# Patient Record
Sex: Female | Born: 1975
Health system: Southern US, Community
[De-identification: ages and names within clinical notes are randomized; demographics above are authoritative.]

## PROBLEM LIST (undated history)

## (undated) DIAGNOSIS — F419 Anxiety disorder, unspecified: Secondary | ICD-10-CM

## (undated) DIAGNOSIS — I10 Essential (primary) hypertension: Secondary | ICD-10-CM

## (undated) DIAGNOSIS — K219 Gastro-esophageal reflux disease without esophagitis: Secondary | ICD-10-CM

## (undated) DIAGNOSIS — D649 Anemia, unspecified: Secondary | ICD-10-CM

## (undated) DIAGNOSIS — E079 Disorder of thyroid, unspecified: Secondary | ICD-10-CM

## (undated) HISTORY — DX: Essential (primary) hypertension: I10

## (undated) HISTORY — DX: Disorder of thyroid, unspecified: E07.9

## (undated) HISTORY — PX: OTHER SURGICAL HISTORY: SHX169

## (undated) HISTORY — DX: Anemia, unspecified: D64.9

## (undated) HISTORY — PX: FINGER FRACTURE SURGERY: SHX638

## (undated) HISTORY — PX: TUBAL LIGATION: SHX77

## (undated) HISTORY — DX: Gastro-esophageal reflux disease without esophagitis: K21.9

---

## 2001-10-01 ENCOUNTER — Encounter: Admission: RE | Admit: 2001-10-01 | Discharge: 2001-10-01 | Payer: Self-pay | Admitting: Family Medicine

## 2001-10-29 ENCOUNTER — Encounter: Admission: RE | Admit: 2001-10-29 | Discharge: 2001-10-29 | Payer: Self-pay | Admitting: Family Medicine

## 2001-12-05 ENCOUNTER — Encounter: Admission: RE | Admit: 2001-12-05 | Discharge: 2001-12-05 | Payer: Self-pay | Admitting: Family Medicine

## 2001-12-15 ENCOUNTER — Encounter: Admission: RE | Admit: 2001-12-15 | Discharge: 2001-12-15 | Payer: Self-pay | Admitting: Family Medicine

## 2001-12-17 ENCOUNTER — Ambulatory Visit (HOSPITAL_COMMUNITY): Admission: RE | Admit: 2001-12-17 | Discharge: 2001-12-17 | Payer: Self-pay | Admitting: *Deleted

## 2002-01-14 ENCOUNTER — Encounter: Admission: RE | Admit: 2002-01-14 | Discharge: 2002-01-14 | Payer: Self-pay | Admitting: Family Medicine

## 2002-02-18 ENCOUNTER — Encounter: Admission: RE | Admit: 2002-02-18 | Discharge: 2002-02-18 | Payer: Self-pay | Admitting: Family Medicine

## 2002-02-20 ENCOUNTER — Encounter: Admission: RE | Admit: 2002-02-20 | Discharge: 2002-02-20 | Payer: Self-pay | Admitting: Family Medicine

## 2002-03-03 ENCOUNTER — Encounter: Admission: RE | Admit: 2002-03-03 | Discharge: 2002-03-03 | Payer: Self-pay | Admitting: *Deleted

## 2002-03-04 ENCOUNTER — Encounter: Admission: RE | Admit: 2002-03-04 | Discharge: 2002-03-04 | Payer: Self-pay | Admitting: Family Medicine

## 2002-03-18 ENCOUNTER — Encounter: Admission: RE | Admit: 2002-03-18 | Discharge: 2002-03-18 | Payer: Self-pay | Admitting: Family Medicine

## 2002-03-30 ENCOUNTER — Encounter: Admission: RE | Admit: 2002-03-30 | Discharge: 2002-03-30 | Payer: Self-pay | Admitting: Family Medicine

## 2002-04-17 ENCOUNTER — Encounter: Admission: RE | Admit: 2002-04-17 | Discharge: 2002-04-17 | Payer: Self-pay | Admitting: Family Medicine

## 2002-04-30 ENCOUNTER — Inpatient Hospital Stay (HOSPITAL_COMMUNITY): Admission: AD | Admit: 2002-04-30 | Discharge: 2002-04-30 | Payer: Self-pay | Admitting: *Deleted

## 2002-05-01 ENCOUNTER — Encounter: Admission: RE | Admit: 2002-05-01 | Discharge: 2002-05-01 | Payer: Self-pay | Admitting: Family Medicine

## 2002-05-07 ENCOUNTER — Encounter: Admission: RE | Admit: 2002-05-07 | Discharge: 2002-05-07 | Payer: Self-pay | Admitting: Family Medicine

## 2002-05-14 ENCOUNTER — Encounter: Admission: RE | Admit: 2002-05-14 | Discharge: 2002-05-14 | Payer: Self-pay | Admitting: Sports Medicine

## 2002-05-18 ENCOUNTER — Encounter: Admission: RE | Admit: 2002-05-18 | Discharge: 2002-05-18 | Payer: Self-pay | Admitting: Family Medicine

## 2002-05-20 ENCOUNTER — Inpatient Hospital Stay (HOSPITAL_COMMUNITY): Admission: AD | Admit: 2002-05-20 | Discharge: 2002-05-22 | Payer: Self-pay | Admitting: *Deleted

## 2002-07-02 ENCOUNTER — Encounter (INDEPENDENT_AMBULATORY_CARE_PROVIDER_SITE_OTHER): Payer: Self-pay | Admitting: *Deleted

## 2002-07-02 ENCOUNTER — Encounter: Admission: RE | Admit: 2002-07-02 | Discharge: 2002-07-02 | Payer: Self-pay | Admitting: Family Medicine

## 2003-09-22 ENCOUNTER — Encounter (INDEPENDENT_AMBULATORY_CARE_PROVIDER_SITE_OTHER): Payer: Self-pay | Admitting: Specialist

## 2003-09-22 ENCOUNTER — Encounter: Admission: RE | Admit: 2003-09-22 | Discharge: 2003-09-22 | Payer: Self-pay | Admitting: Family Medicine

## 2004-02-05 ENCOUNTER — Inpatient Hospital Stay (HOSPITAL_COMMUNITY): Admission: AD | Admit: 2004-02-05 | Discharge: 2004-02-05 | Payer: Self-pay | Admitting: Obstetrics and Gynecology

## 2004-09-19 ENCOUNTER — Encounter (INDEPENDENT_AMBULATORY_CARE_PROVIDER_SITE_OTHER): Payer: Self-pay | Admitting: *Deleted

## 2004-09-19 LAB — CONVERTED CEMR LAB

## 2004-09-21 ENCOUNTER — Ambulatory Visit: Payer: Self-pay | Admitting: Family Medicine

## 2004-09-28 ENCOUNTER — Ambulatory Visit: Payer: Self-pay | Admitting: Family Medicine

## 2004-10-02 ENCOUNTER — Ambulatory Visit (HOSPITAL_COMMUNITY): Admission: RE | Admit: 2004-10-02 | Discharge: 2004-10-02 | Payer: Self-pay | Admitting: Family Medicine

## 2004-11-21 ENCOUNTER — Ambulatory Visit: Payer: Self-pay | Admitting: Family Medicine

## 2004-11-22 ENCOUNTER — Ambulatory Visit: Payer: Self-pay | Admitting: Family Medicine

## 2004-12-05 ENCOUNTER — Ambulatory Visit: Payer: Self-pay | Admitting: Family Medicine

## 2004-12-19 ENCOUNTER — Ambulatory Visit: Payer: Self-pay | Admitting: Family Medicine

## 2005-01-04 ENCOUNTER — Ambulatory Visit: Payer: Self-pay | Admitting: Family Medicine

## 2005-01-11 ENCOUNTER — Ambulatory Visit: Payer: Self-pay | Admitting: Family Medicine

## 2005-01-11 ENCOUNTER — Inpatient Hospital Stay (HOSPITAL_COMMUNITY): Admission: AD | Admit: 2005-01-11 | Discharge: 2005-01-14 | Payer: Self-pay | Admitting: *Deleted

## 2005-01-11 ENCOUNTER — Ambulatory Visit: Payer: Self-pay | Admitting: Sports Medicine

## 2005-01-12 ENCOUNTER — Encounter (INDEPENDENT_AMBULATORY_CARE_PROVIDER_SITE_OTHER): Payer: Self-pay | Admitting: Specialist

## 2005-01-15 ENCOUNTER — Encounter: Admission: RE | Admit: 2005-01-15 | Discharge: 2005-02-14 | Payer: Self-pay | Admitting: Family Medicine

## 2005-03-15 ENCOUNTER — Encounter: Admission: RE | Admit: 2005-03-15 | Discharge: 2005-04-14 | Payer: Self-pay | Admitting: Family Medicine

## 2005-05-15 ENCOUNTER — Encounter: Admission: RE | Admit: 2005-05-15 | Discharge: 2005-06-14 | Payer: Self-pay | Admitting: Family Medicine

## 2005-06-05 ENCOUNTER — Emergency Department (HOSPITAL_COMMUNITY): Admission: EM | Admit: 2005-06-05 | Discharge: 2005-06-05 | Payer: Self-pay | Admitting: Emergency Medicine

## 2005-06-12 ENCOUNTER — Ambulatory Visit: Payer: Self-pay | Admitting: Sports Medicine

## 2005-06-22 ENCOUNTER — Encounter (HOSPITAL_COMMUNITY): Admission: RE | Admit: 2005-06-22 | Discharge: 2005-08-20 | Payer: Self-pay | Admitting: Vascular Surgery

## 2005-06-29 ENCOUNTER — Ambulatory Visit: Payer: Self-pay | Admitting: Family Medicine

## 2005-07-15 ENCOUNTER — Encounter: Admission: RE | Admit: 2005-07-15 | Discharge: 2005-08-14 | Payer: Self-pay | Admitting: Family Medicine

## 2005-08-15 ENCOUNTER — Encounter: Admission: RE | Admit: 2005-08-15 | Discharge: 2005-09-13 | Payer: Self-pay | Admitting: Family Medicine

## 2005-09-14 ENCOUNTER — Encounter: Admission: RE | Admit: 2005-09-14 | Discharge: 2005-10-14 | Payer: Self-pay | Admitting: Family Medicine

## 2005-10-15 ENCOUNTER — Encounter: Admission: RE | Admit: 2005-10-15 | Discharge: 2005-11-13 | Payer: Self-pay | Admitting: Family Medicine

## 2005-11-14 ENCOUNTER — Encounter: Admission: RE | Admit: 2005-11-14 | Discharge: 2005-12-14 | Payer: Self-pay | Admitting: Family Medicine

## 2005-11-16 ENCOUNTER — Emergency Department (HOSPITAL_COMMUNITY): Admission: EM | Admit: 2005-11-16 | Discharge: 2005-11-16 | Payer: Self-pay | Admitting: Family Medicine

## 2005-12-04 ENCOUNTER — Emergency Department (HOSPITAL_COMMUNITY): Admission: EM | Admit: 2005-12-04 | Discharge: 2005-12-04 | Payer: Self-pay | Admitting: Emergency Medicine

## 2005-12-07 ENCOUNTER — Ambulatory Visit: Payer: Self-pay | Admitting: Family Medicine

## 2005-12-15 ENCOUNTER — Encounter: Admission: RE | Admit: 2005-12-15 | Discharge: 2006-01-14 | Payer: Self-pay | Admitting: Family Medicine

## 2006-01-15 ENCOUNTER — Ambulatory Visit: Payer: Self-pay | Admitting: Family Medicine

## 2006-01-15 ENCOUNTER — Encounter: Admission: RE | Admit: 2006-01-15 | Discharge: 2006-02-11 | Payer: Self-pay | Admitting: Family Medicine

## 2006-02-11 ENCOUNTER — Emergency Department (HOSPITAL_COMMUNITY): Admission: EM | Admit: 2006-02-11 | Discharge: 2006-02-11 | Payer: Self-pay | Admitting: Emergency Medicine

## 2006-02-12 ENCOUNTER — Encounter: Admission: RE | Admit: 2006-02-12 | Discharge: 2006-03-14 | Payer: Self-pay | Admitting: Family Medicine

## 2006-02-27 ENCOUNTER — Emergency Department (HOSPITAL_COMMUNITY): Admission: EM | Admit: 2006-02-27 | Discharge: 2006-02-27 | Payer: Self-pay | Admitting: Emergency Medicine

## 2006-03-19 ENCOUNTER — Ambulatory Visit: Payer: Self-pay | Admitting: Family Medicine

## 2006-05-01 ENCOUNTER — Ambulatory Visit: Payer: Self-pay | Admitting: Family Medicine

## 2006-10-05 IMAGING — CT CT HEAD W/O CM
1 of 2 series · 13 of 30 positions shown, 17 images · IV contrast (agent unspecified)
Comparison: None.

CLINICAL DATA: Blurry vision with flashes of light.  Headache.  Dizziness.  Generalized fatigue.
 HEAD CT WITHOUT CONTRAST:
TECHNIQUE: Contiguous axial images were obtained from the base of the skull through the vertex according to standard protocol without contrast.

[Series 2: brain · axial · 0.49mm/px · z∈[+138,+269]mm · 13 of 32 slices shown, 17 images]
[im 3/32  brain]
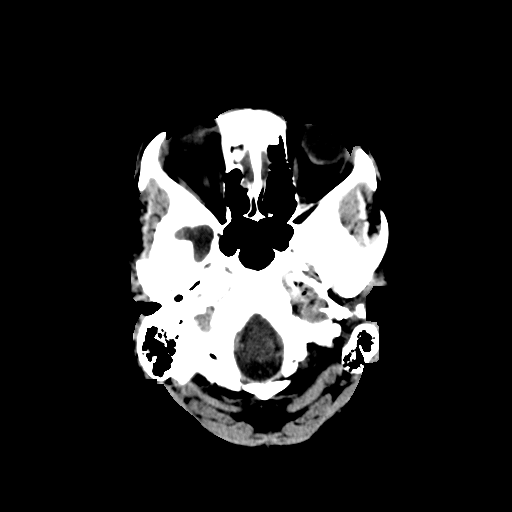
[im 3/32  bone]
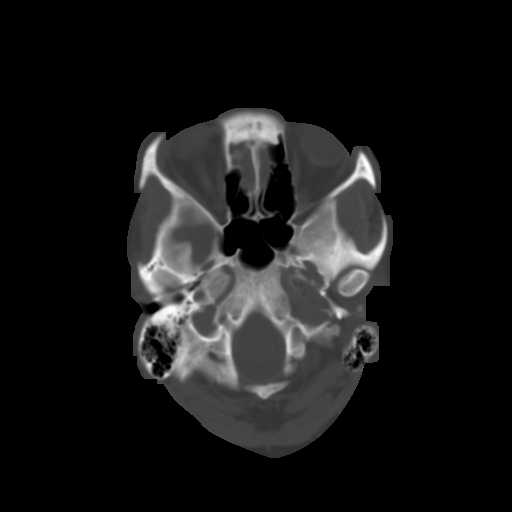
[im 5/32  brain]
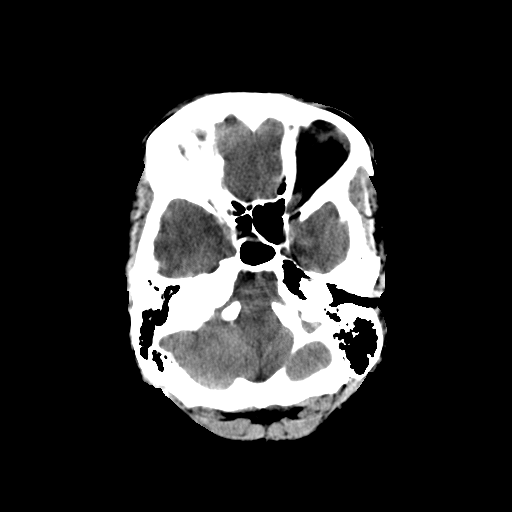
[im 7/32  brain]
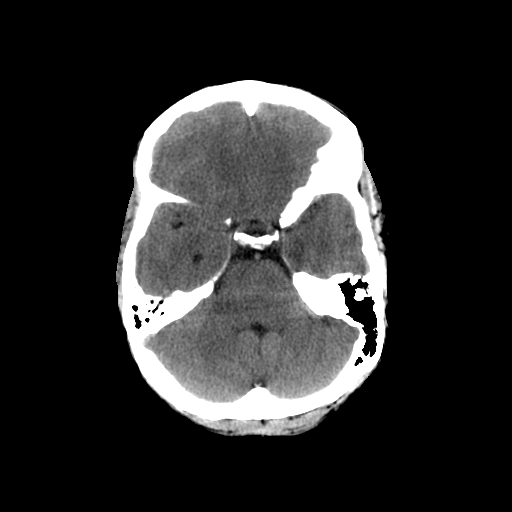
[im 9/32  brain]
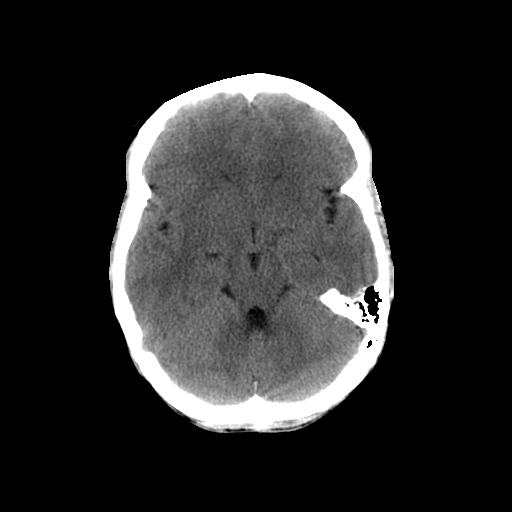
[im 12/32  brain]
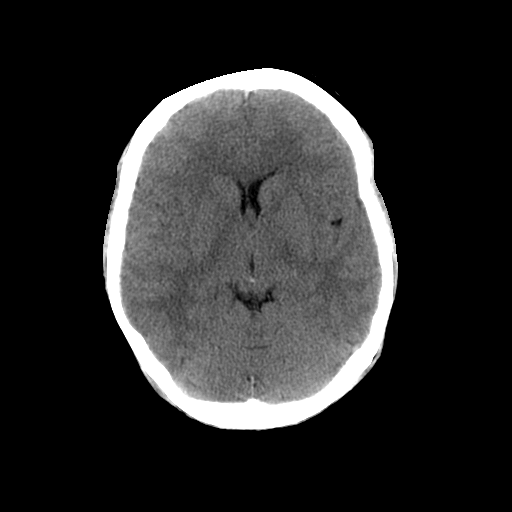
[im 12/32  bone]
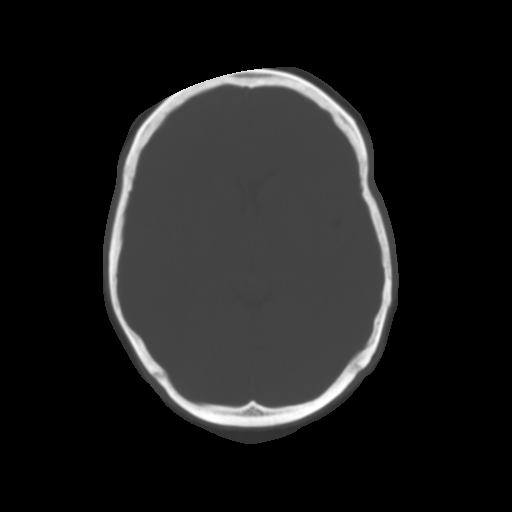
[im 14/32  brain]
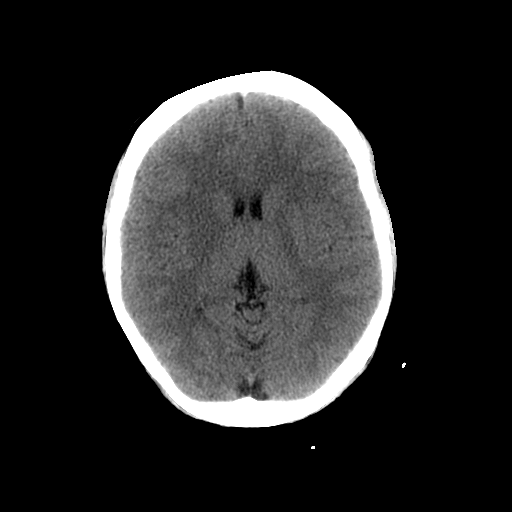
[im 16/32  brain]
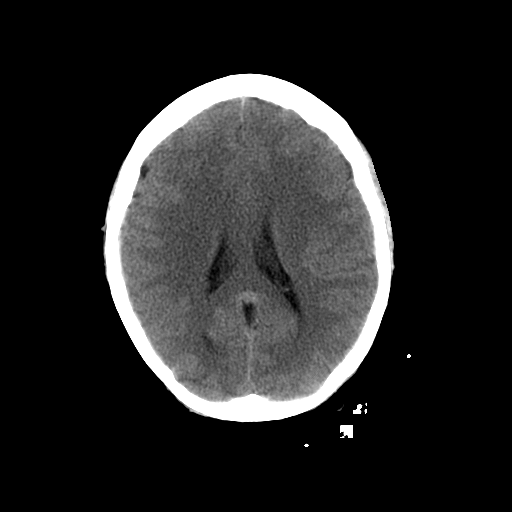
[im 18/32  brain]
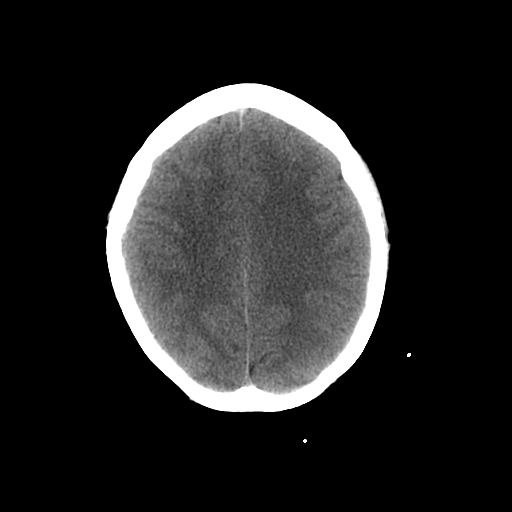
[im 20/32  brain]
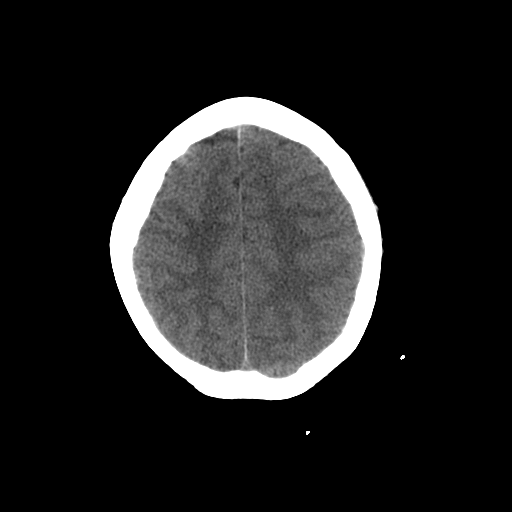
[im 20/32  bone]
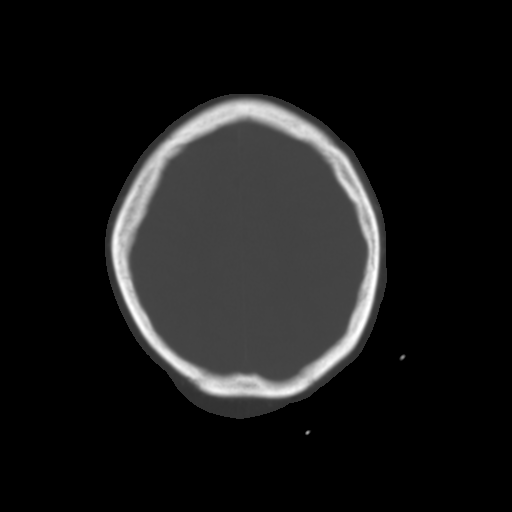
[im 23/32  brain]
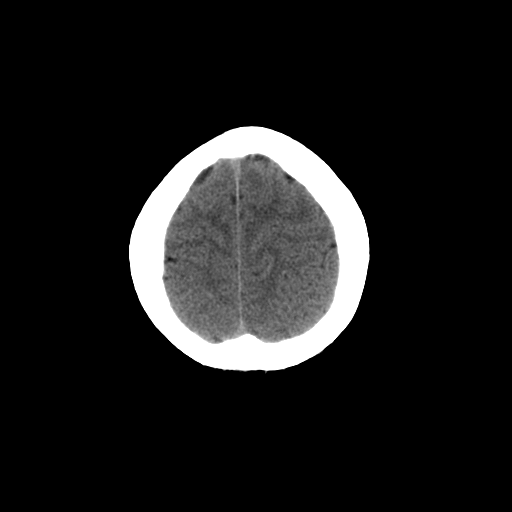
[im 25/32  brain]
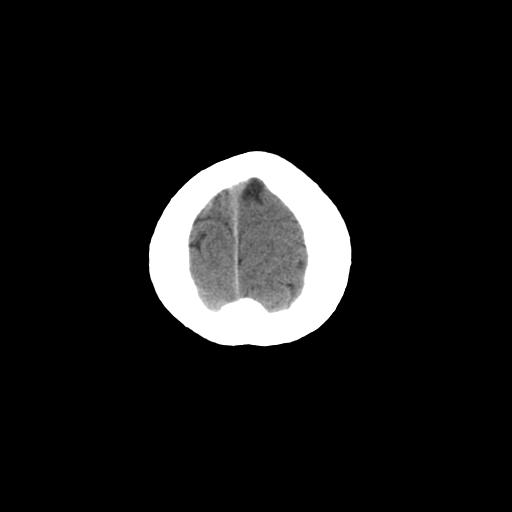
[im 27/32  brain]
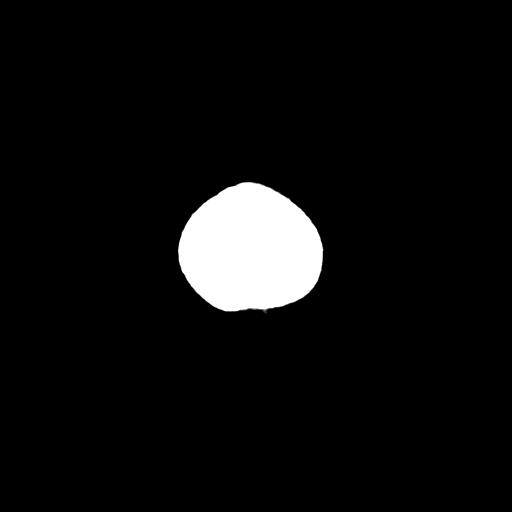
[im 29/32  brain]
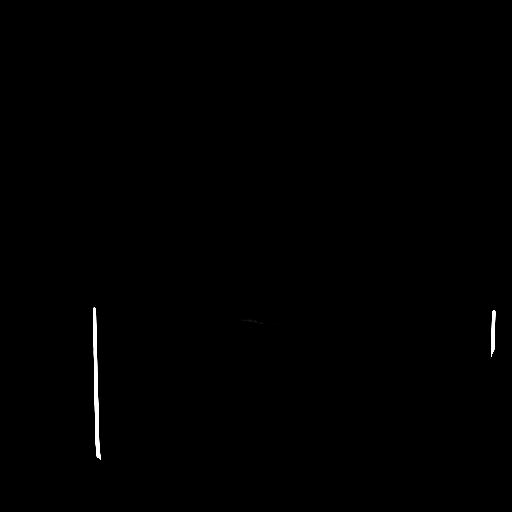
[im 29/32  bone]
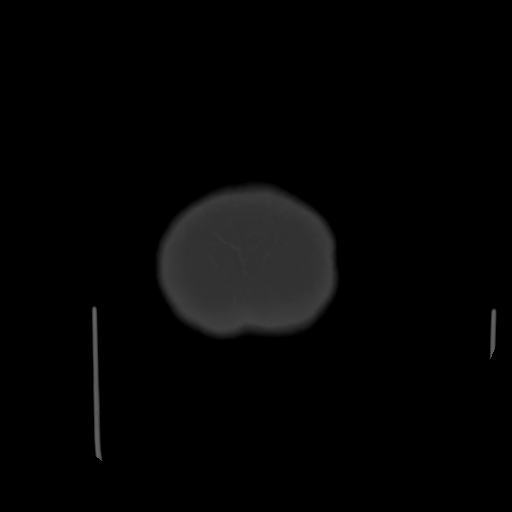

[13 of 30 positions shown; findings below may reference images not displayed]

FINDINGS: There is no evidence for acute hemorrhage, hydrocephalus, mass effect, or abnormal extra-axial fluid collection.  No definite CT evidence for acute ischemia.  
 Frontal sinuses are hypoplastic.  Aerated paranasal sinuses are without evidence for mucosal thickening or air fluid levels.
IMPRESSION: No acute intracranial abnormality.

## 2007-01-17 ENCOUNTER — Encounter (INDEPENDENT_AMBULATORY_CARE_PROVIDER_SITE_OTHER): Payer: Self-pay | Admitting: *Deleted

## 2007-02-12 ENCOUNTER — Ambulatory Visit: Payer: Self-pay | Admitting: Sports Medicine

## 2007-02-12 ENCOUNTER — Encounter: Payer: Self-pay | Admitting: Family Medicine

## 2007-02-12 DIAGNOSIS — Z8639 Personal history of other endocrine, nutritional and metabolic disease: Secondary | ICD-10-CM | POA: Insufficient documentation

## 2007-02-12 LAB — CONVERTED CEMR LAB
GC Probe Amp, Genital: NEGATIVE
TSH: 0.673 microintl units/mL (ref 0.350–5.50)

## 2009-03-16 ENCOUNTER — Ambulatory Visit: Payer: Self-pay | Admitting: Family Medicine

## 2009-03-16 ENCOUNTER — Encounter: Payer: Self-pay | Admitting: Family Medicine

## 2009-03-16 LAB — CONVERTED CEMR LAB: Whiff Test: POSITIVE

## 2009-03-18 LAB — CONVERTED CEMR LAB
Chlamydia, DNA Probe: NEGATIVE
Cholesterol: 223 mg/dL — ABNORMAL HIGH (ref 0–200)
GC Probe Amp, Genital: NEGATIVE
HDL: 73 mg/dL (ref >39)
TSH: 1.403 microintl units/mL (ref 0.350–4.500)
Triglycerides: 87 mg/dL (ref <150)
VLDL: 17 mg/dL (ref 0–40)

## 2010-12-09 ENCOUNTER — Encounter: Payer: Self-pay | Admitting: Family Medicine

## 2010-12-10 ENCOUNTER — Encounter: Payer: Self-pay | Admitting: Vascular Surgery

## 2011-02-16 ENCOUNTER — Encounter: Payer: Self-pay | Admitting: Family Medicine

## 2011-03-12 ENCOUNTER — Other Ambulatory Visit (HOSPITAL_COMMUNITY)
Admission: RE | Admit: 2011-03-12 | Discharge: 2011-03-12 | Disposition: A | Discharge status: Home / Self Care | Payer: 59 | Source: Ambulatory Visit | Attending: Family Medicine | Admitting: Family Medicine

## 2011-03-12 ENCOUNTER — Encounter: Payer: Self-pay | Admitting: Family Medicine

## 2011-03-12 ENCOUNTER — Ambulatory Visit (INDEPENDENT_AMBULATORY_CARE_PROVIDER_SITE_OTHER): Payer: 59 | Admitting: Family Medicine

## 2011-03-12 DIAGNOSIS — I10 Essential (primary) hypertension: Secondary | ICD-10-CM

## 2011-03-12 DIAGNOSIS — Z124 Encounter for screening for malignant neoplasm of cervix: Secondary | ICD-10-CM

## 2011-03-12 DIAGNOSIS — Z01419 Encounter for gynecological examination (general) (routine) without abnormal findings: Secondary | ICD-10-CM | POA: Insufficient documentation

## 2011-03-12 DIAGNOSIS — Z862 Personal history of diseases of the blood and blood-forming organs and certain disorders involving the immune mechanism: Secondary | ICD-10-CM

## 2011-03-12 DIAGNOSIS — E669 Obesity, unspecified: Secondary | ICD-10-CM

## 2011-03-12 DIAGNOSIS — Z8639 Personal history of other endocrine, nutritional and metabolic disease: Secondary | ICD-10-CM

## 2011-03-12 MED ORDER — METOPROLOL SUCCINATE ER 25 MG PO TB24
25.0000 mg | ORAL_TABLET | Freq: Every day | ORAL | Status: DC
Start: 2011-03-12 — End: 2011-03-12

## 2011-03-12 MED ORDER — METHIMAZOLE 10 MG PO TABS: 10.0000 mg | ORAL_TABLET | Freq: Three times a day (TID) | ORAL | Status: DC

## 2011-03-12 MED ORDER — METOPROLOL SUCCINATE ER 25 MG PO TB24: 25.0000 mg | ORAL_TABLET | Freq: Every day | ORAL | Status: DC

## 2011-03-12 NOTE — Patient Instructions (Signed)
It was nice to meet you.  I want you to start taking your medications again.  For now, I will leave them at the dose you were taking a year ago.  I want you to make a lab appointment for some time, and we will check your thyroid, your cholesterol, your blood sugar, and your kidney function.  I will contact you with the lab results.  Please make an appointment to follow up on your thyroid in 3 months.

## 2011-03-13 NOTE — Progress Notes (Signed)
  Subjective:    Patient ID: Alexandra Benjamin, female    DOB: 09/24/1976, 35 y.o.   MRN: [id number]  HPI Patient presents for well woman exam.  She reports that she has not been to the doctor in a long time, and she has stopped taking all her medications, even though she knows she needs them.  She stopped taking them about one year ago.   She says she can tell she needs her thyroid checked and needs to take her thyroid medication because she has been feeling her heart race from time to time, and feels warm all the time compared to everyone else.    She knows that her blood pressure reading today is too high, but says she does not check her blood pressure at home or in a store, and would not know it was high by how she feels.   Obesity- she says she has lost some weight but knows that can happen when her thyroid is misbehaving.  She has not made significant diet or exercise changes.     Review of Systems  Constitutional: Negative for fever.  HENT: Negative for rhinorrhea.   Eyes: Negative for visual disturbance.  Respiratory: Negative for shortness of breath.   Cardiovascular: Positive for palpitations. Negative for chest pain.  Gastrointestinal: Positive for diarrhea.  Genitourinary: Negative for dysuria.  Musculoskeletal: Negative for myalgias and arthralgias.  Skin: Negative for rash.  Neurological: Positive for tremors. Negative for dizziness.  Psychiatric/Behavioral: Negative for dysphoric mood.       Objective:   Physical Exam BP 141/84  Pulse 78  Temp(Src) 98.9 F (37.2 C) (Oral)  Ht 5\' 5"  (1.651 m)  Wt 184 lb (83.462 kg)  BMI 30.62 kg/m2  LMP 02/12/2011 General appearance: alert, cooperative and no distress Eyes: conjunctivae/corneas clear. PERRL, EOM's intact. Fundi benign. Throat: lips, mucosa, and tongue normal; teeth and gums normal Neck: no adenopathy, no JVD, supple, symmetrical, trachea midline and thyroid not enlarged, symmetric, no tenderness/mass/nodules Lungs:  clear to auscultation bilaterally Breasts: normal appearance, no masses or tenderness, No nipple retraction or dimpling Heart: regular rate and rhythm, S1, S2 normal, no murmur, click, rub or gallop Abdomen: soft, non-tender; bowel sounds normal; no masses,  no organomegaly Pelvic: cervix normal in appearance, external genitalia normal, no adnexal masses or tenderness, no cervical motion tenderness, rectovaginal septum normal, uterus normal size, shape, and consistency and vagina normal without discharge Pulses: 2+ and symmetric       Assessment & Plan:

## 2011-03-13 NOTE — Assessment & Plan Note (Signed)
Restart metoprolol XL at previous dose.  Have asked patient to return in 3 months to ensure it is under better control.

## 2011-03-13 NOTE — Assessment & Plan Note (Addendum)
Will check cholesterol and fasting blood sugar as she is at high-risk for HLD and DM.  Patient has lost weight but is likely hyperthyroid.  Encouraged lifestyle changes.

## 2011-03-13 NOTE — Assessment & Plan Note (Addendum)
Will check TSH.  For now, restart methimazole at previous dose, and will adjust as appropriate when lab results back. Have asked patient to return in 3 months to be sure she is on the correct dose.

## 2011-03-15 ENCOUNTER — Other Ambulatory Visit: Payer: 59

## 2011-03-15 DIAGNOSIS — E669 Obesity, unspecified: Secondary | ICD-10-CM

## 2011-03-15 DIAGNOSIS — I10 Essential (primary) hypertension: Secondary | ICD-10-CM

## 2011-03-15 DIAGNOSIS — Z8639 Personal history of other endocrine, nutritional and metabolic disease: Secondary | ICD-10-CM

## 2011-03-15 LAB — LIPID PANEL
HDL: 71 mg/dL (ref >39)
LDL Cholesterol: 139 mg/dL — ABNORMAL HIGH (ref 0–99)
VLDL: 12 mg/dL (ref 0–40)

## 2011-03-15 LAB — BASIC METABOLIC PANEL
CO2: 26 mEq/L (ref 19–32)
Chloride: 105 mEq/L (ref 96–112)
Potassium: 4.2 mEq/L (ref 3.5–5.3)
Sodium: 140 mEq/L (ref 135–145)

## 2011-03-15 LAB — TSH: TSH: 2.599 u[IU]/mL (ref 0.350–4.500)

## 2011-03-15 NOTE — Progress Notes (Signed)
Cmp,tsh and flp done today [facility] Virella

## 2011-03-19 ENCOUNTER — Encounter: Payer: Self-pay | Admitting: Family Medicine

## 2011-03-19 MED ORDER — METHIMAZOLE 5 MG PO TABS: 5.0000 mg | ORAL_TABLET | Freq: Three times a day (TID) | ORAL | Status: DC

## 2011-04-06 NOTE — Op Note (Signed)
NAMEJONIAH, BEDNARSKI              ACCOUNT NO.:  [id number]   MEDICAL RECORD NO.:  [id number]          PATIENT TYPE:  INP   LOCATION:  9165                          FACILITY:  [facility]   PHYSICIAN:  Conni Elliot, M.D.DATE OF BIRTH:  Nov 19, 1976   DATE OF PROCEDURE:  01/12/2005  DATE OF DISCHARGE:                                 OPERATIVE REPORT   PREOPERATIVE DIAGNOSIS:  Desires surgical sterilization.   POSTOPERATIVE DIAGNOSIS:  Desires surgical sterilization.   PROCEDURE:  Modified bilateral Pomeroy tubal ligation.   SURGEON:  Conni Elliot, M.D.   ANESTHESIA:  Continuous lumbar epidural.   DESCRIPTION OF PROCEDURE:  After placing the patient under continuous lumbar  epidural anesthetic with the patient supine, the abdomen was prepped and  draped in the usual sterile fashion.  A periumbilical incision was made and  incision made through the skin, subcutaneous tissue, and fascia.  The  peritoneal cavity was entered.  The right fallopian tube was identified and  grasped with a Babcock clamp, followed to its fimbriated end.  The tube was  brought into the operative field and doubly suture ligated.  1.5 to 2 cm  segment was excised.  Hemostasis was adequate.  A similar procedure was done  on the opposite side.  Hemostasis was again adequate.  Anterior peritoneum,  fascia, subcutaneous tissue, and skin were closed in routine fashion.  Estimated blood loss less than 10 mL.  Needle, sponge, and instrument counts  correct.      ASG/MEDQ  D:  01/12/2005  T:  01/12/2005  Job:  [id number]

## 2012-03-31 ENCOUNTER — Encounter: Payer: 59 | Admitting: Family Medicine

## 2012-04-11 ENCOUNTER — Encounter: Payer: 59 | Admitting: Family Medicine

## 2012-04-24 ENCOUNTER — Encounter: Payer: 59 | Admitting: Family Medicine

## 2012-05-01 ENCOUNTER — Other Ambulatory Visit: Payer: Self-pay | Admitting: Family Medicine

## 2012-06-09 ENCOUNTER — Encounter: Payer: 59 | Admitting: Family Medicine

## 2013-08-07 ENCOUNTER — Emergency Department (HOSPITAL_COMMUNITY)
Admission: EM | Admit: 2013-08-07 | Discharge: 2013-08-07 | Disposition: A | Discharge status: Home / Self Care | Payer: 59 | Source: Home / Self Care

## 2013-08-07 ENCOUNTER — Encounter (HOSPITAL_COMMUNITY): Payer: Self-pay | Admitting: Emergency Medicine

## 2013-08-07 DIAGNOSIS — H0011 Chalazion right upper eyelid: Secondary | ICD-10-CM

## 2013-08-07 NOTE — ED Provider Notes (Signed)
CSN: [id number]     Arrival date & time 08/07/13  1140 History   None    Chief Complaint  Patient presents with  . Stye    right x 1 month.    (Consider location/radiation/quality/duration/timing/severity/associated sxs/prior Treatment) HPI Comments: Mrs. Cockerham presents with a right upper eyelid white area that has been non-responding to compresses. Onset x 1 month. No drainage. No pain. No loss in vision. No eye redness.   The history is provided by the patient.    Past Medical History  Diagnosis Date  . Hypertension   . Thyroid disease    Past Surgical History  Procedure Laterality Date  . Tubal ligation    . Finger fracture surgery     Family History  Problem Relation Age of Onset  . Heart disease Father   . Diabetes Maternal Grandmother   . Diabetes Paternal Grandmother    History  Substance Use Topics  . Smoking status: Former Games developer  . Smokeless tobacco: Not on file  . Alcohol Use: 0.0 oz/week    1-2 Glasses of wine per week   OB History   Grav Para Term Preterm Abortions TAB SAB Ect Mult Living                 Review of Systems  All other systems reviewed and are negative.    Allergies  Review of patient's allergies indicates no known allergies.  Home Medications   Current Outpatient Rx  Name  Route  Sig  Dispense  Refill  . metoprolol succinate (TOPROL-XL) 25 MG 24 hr tablet      TAKE 1 TABLET BY MOUTH DAILY.   30 tablet   11   . EXPIRED: methimazole (TAPAZOLE) 5 MG tablet   Oral   Take 1 tablet (5 mg total) by mouth 3 (three) times daily.   90 tablet   1    BP 129/95  Pulse 64  Temp(Src) 98.4 F (36.9 C) (Oral)  Resp 14  SpO2 100%  LMP 07/10/2013 Physical Exam  Nursing note and vitals reviewed. Constitutional: She is oriented to person, place, and time. She appears well-developed and well-nourished.  HENT:  Head: Normocephalic and atraumatic.  Eyes: Pupils are equal, round, and reactive to light.  Right upper lid with white,  glandular localized inflammation, no pain to palpation. No signs of infection. Smaller than a eraser head  Neurological: She is alert and oriented to person, place, and time.  Skin: Skin is warm and dry.  Psychiatric: Her behavior is normal.    ED Course  Procedures (including critical care time) Labs Review Labs Reviewed - No data to display Imaging Review No results found.  MDM   1. Chalazion of right upper eyelid    Call for an appt with on call Opth for removal.      Azucena Fallen, PA-C 08/07/13 1256

## 2013-08-07 NOTE — ED Notes (Signed)
C/o stye on right eye for over a month. Pt states that she uses warm compresses and it goes down some but returns. Also using saline drops with no relief.  Denies pain or vision problems.

## 2013-08-12 NOTE — ED Provider Notes (Signed)
Medical screening examination/treatment/procedure(s) were performed by resident physician or non-physician practitioner and as supervising physician I was immediately available for consultation/collaboration.   Barkley Bruns MD.   Linna Hoff, MD 08/12/13 [phone]

## 2014-04-25 ENCOUNTER — Emergency Department (HOSPITAL_COMMUNITY)
Admission: EM | Admit: 2014-04-25 | Discharge: 2014-04-25 | Disposition: A | Discharge status: Home / Self Care | Payer: 59 | Source: Home / Self Care | Attending: Family Medicine | Admitting: Family Medicine

## 2014-04-25 ENCOUNTER — Encounter (HOSPITAL_COMMUNITY): Payer: Self-pay | Admitting: Emergency Medicine

## 2014-04-25 DIAGNOSIS — K529 Noninfective gastroenteritis and colitis, unspecified: Secondary | ICD-10-CM

## 2014-04-25 DIAGNOSIS — K5289 Other specified noninfective gastroenteritis and colitis: Secondary | ICD-10-CM

## 2014-04-25 LAB — POCT URINALYSIS DIP (DEVICE)
BILIRUBIN URINE: NEGATIVE
GLUCOSE, UA: NEGATIVE mg/dL
Hgb urine dipstick: NEGATIVE
Ketones, ur: 15 mg/dL — AB
Leukocytes, UA: NEGATIVE
Nitrite: NEGATIVE
Protein, ur: NEGATIVE mg/dL
SPECIFIC GRAVITY, URINE: 1.015 (ref 1.005–1.030)
Urobilinogen, UA: 0.2 mg/dL (ref 0.0–1.0)
pH: 7 (ref 5.0–8.0)

## 2014-04-25 LAB — COMPREHENSIVE METABOLIC PANEL
ALT: 11 U/L (ref 0–35)
AST: 16 U/L (ref 0–37)
Albumin: 4 g/dL (ref 3.5–5.2)
Alkaline Phosphatase: 49 U/L (ref 39–117)
BUN: 12 mg/dL (ref 6–23)
CALCIUM: 10.3 mg/dL (ref 8.4–10.5)
CO2: 25 meq/L (ref 19–32)
Chloride: 100 mEq/L (ref 96–112)
Creatinine, Ser: 0.69 mg/dL (ref 0.50–1.10)
GFR calc Af Amer: >90 mL/min (ref >90)
GLUCOSE: 88 mg/dL (ref 70–99)
Potassium: 3.8 mEq/L (ref 3.7–5.3)
SODIUM: 139 meq/L (ref 137–147)
Total Bilirubin: 0.4 mg/dL (ref 0.3–1.2)
Total Protein: 8.2 g/dL (ref 6.0–8.3)

## 2014-04-25 LAB — POCT PREGNANCY, URINE: PREG TEST UR: NEGATIVE

## 2014-04-25 LAB — CBC
HEMATOCRIT: 37.2 % (ref 36.0–46.0)
Hemoglobin: 12.4 g/dL (ref 12.0–15.0)
MCH: 26.6 pg (ref 26.0–34.0)
MCHC: 33.3 g/dL (ref 30.0–36.0)
MCV: 79.8 fL (ref 78.0–100.0)
Platelets: 308 10*3/uL (ref 150–400)
RBC: 4.66 MIL/uL (ref 3.87–5.11)
RDW: 13.9 % (ref 11.5–15.5)
WBC: 9 10*3/uL (ref 4.0–10.5)

## 2014-04-25 LAB — LIPASE, BLOOD: Lipase: 22 U/L (ref 11–59)

## 2014-04-25 MED ORDER — OMEPRAZOLE 40 MG PO CPDR: 40.0000 mg | DELAYED_RELEASE_CAPSULE | Freq: Every day | ORAL | Status: DC

## 2014-04-25 MED ORDER — ONDANSETRON HCL 4 MG/2ML IJ SOLN
INTRAMUSCULAR | Status: AC
  Filled 2014-04-25: qty 2

## 2014-04-25 MED ORDER — SODIUM CHLORIDE 0.9 % IV BOLUS (SEPSIS)
1000.0000 mL | Freq: Once | INTRAVENOUS | Status: AC
  Administered 2014-04-25: 1000 mL via INTRAVENOUS

## 2014-04-25 MED ORDER — ONDANSETRON HCL 8 MG PO TABS: 8.0000 mg | ORAL_TABLET | Freq: Three times a day (TID) | ORAL | Status: DC | PRN

## 2014-04-25 MED ORDER — ONDANSETRON HCL 4 MG/2ML IJ SOLN
4.0000 mg | Freq: Once | INTRAMUSCULAR | Status: AC
  Administered 2014-04-25: 4 mg via INTRAVENOUS

## 2014-04-25 NOTE — ED Notes (Signed)
Pt unable to give urine sample at this time.  She will try after IV is finished

## 2014-04-25 NOTE — ED Provider Notes (Signed)
Alexandra Benjamin is a 38 y.o. female who presents to Urgent Care today for vomiting and diarrhea starting today. This is associated with abdominal cramping. She denies any fevers or chills trouble breathing chest pain or palpitations. She has tried some Pepto-Bismol which has not helped. She denies any blood in the vomit or diarrhea. She feels well otherwise.   Past Medical History  Diagnosis Date  . Hypertension   . Thyroid disease    History  Substance Use Topics  . Smoking status: Former Games developermoker  . Smokeless tobacco: Not on file  . Alcohol Use: 0.0 oz/week    1-2 Glasses of wine per week   ROS as above Medications: No current facility-administered medications for this encounter.   Current Outpatient Prescriptions  Medication Sig Dispense Refill  . methimazole (TAPAZOLE) 5 MG tablet Take 1 tablet (5 mg total) by mouth 3 (three) times daily.  90 tablet  1  . metoprolol succinate (TOPROL-XL) 25 MG 24 hr tablet TAKE 1 TABLET BY MOUTH DAILY.  30 tablet  11  . omeprazole (PRILOSEC) 40 MG capsule Take 1 capsule (40 mg total) by mouth daily.  30 capsule  1  . ondansetron (ZOFRAN) 8 MG tablet Take 1 tablet (8 mg total) by mouth every 8 (eight) hours as needed for nausea or vomiting.  20 tablet  0    Exam:  BP 146/104  Pulse 112  Temp(Src) 98.8 F (37.1 C) (Oral)  Resp 16  SpO2 100%  LMP 04/02/2014 Following 1 L of normal saline patient's heart rate decreased to 72 beats per minute  Gen: Well NAD HEENT: EOMI,  MMM Lungs: Normal work of breathing. CTABL Heart: Tachycardia but regular no MRG Abd: NABS, Soft. Nondistended. Mildly tender palpation upper left quadrant without rebound or guarding. Exts: Brisk capillary refill, warm and well perfused.   She was given 1 L normal saline and 4 mg of IV Zofran. She felt much better. Her heart rate normalized.  Results for orders placed during the hospital encounter of 04/25/14 (from the past 24 hour(s))  CBC     Status: None   Collection  Time    04/25/14  6:00 PM      Result Value Ref Range   WBC 9.0  4.0 - 10.5 K/uL   RBC 4.66  3.87 - 5.11 MIL/uL   Hemoglobin 12.4  12.0 - 15.0 g/dL   HCT 16.137.2  09.636.0 - 04.546.0 %   MCV 79.8  78.0 - 100.0 fL   MCH 26.6  26.0 - 34.0 pg   MCHC 33.3  30.0 - 36.0 g/dL   RDW 40.913.9  81.111.5 - 91.415.5 %   Platelets 308  150 - 400 K/uL  COMPREHENSIVE METABOLIC PANEL     Status: None   Collection Time    04/25/14  6:00 PM      Result Value Ref Range   Sodium 139  137 - 147 mEq/L   Potassium 3.8  3.7 - 5.3 mEq/L   Chloride 100  96 - 112 mEq/L   CO2 25  19 - 32 mEq/L   Glucose, Bld 88  70 - 99 mg/dL   BUN 12  6 - 23 mg/dL   Creatinine, Ser 7.820.69  0.50 - 1.10 mg/dL   Calcium 95.610.3  8.4 - 21.310.5 mg/dL   Total Protein 8.2  6.0 - 8.3 g/dL   Albumin 4.0  3.5 - 5.2 g/dL   AST 16  0 - 37 U/L   ALT 11  0 -  35 U/L   Alkaline Phosphatase 49  39 - 117 U/L   Total Bilirubin 0.4  0.3 - 1.2 mg/dL   GFR calc non Af Amer >90  >90 mL/min   GFR calc Af Amer >90  >90 mL/min  LIPASE, BLOOD     Status: None   Collection Time    04/25/14  6:00 PM      Result Value Ref Range   Lipase 22  11 - 59 U/L  POCT URINALYSIS DIP (DEVICE)     Status: Abnormal   Collection Time    04/25/14  6:58 PM      Result Value Ref Range   Glucose, UA NEGATIVE  NEGATIVE mg/dL   Bilirubin Urine NEGATIVE  NEGATIVE   Ketones, ur 15 (*) NEGATIVE mg/dL   Specific Gravity, Urine 1.015  1.005 - 1.030   Hgb urine dipstick NEGATIVE  NEGATIVE   pH 7.0  5.0 - 8.0   Protein, ur NEGATIVE  NEGATIVE mg/dL   Urobilinogen, UA 0.2  0.0 - 1.0 mg/dL   Nitrite NEGATIVE  NEGATIVE   Leukocytes, UA NEGATIVE  NEGATIVE  POCT PREGNANCY, URINE     Status: None   Collection Time    04/25/14  7:02 PM      Result Value Ref Range   Preg Test, Ur NEGATIVE  NEGATIVE   No results found.  Assessment and Plan: 38 y.o. female with gastroenteritis. Plan to treat with Zofran. Additionally use omeprazole for possible mild gastritis. Watchful waiting. Followup with  primary care provider.  Discussed warning signs or symptoms. Please see discharge instructions. Patient expresses understanding.    Rodolph Bong, MD 04/25/14 [phone]

## 2014-04-25 NOTE — Discharge Instructions (Signed)
Thank you for coming in today. If your belly pain worsens, or you have high fever, bad vomiting, blood in your stool or black tarry stool go to the Emergency Room.   Viral Gastroenteritis Viral gastroenteritis is also known as stomach flu. This condition affects the stomach and intestinal tract. It can cause sudden diarrhea and vomiting. The illness typically lasts 3 to 8 days. Most people develop an immune response that eventually gets rid of the virus. While this natural response develops, the virus can make you quite ill. CAUSES  Many different viruses can cause gastroenteritis, such as rotavirus or noroviruses. You can catch one of these viruses by consuming contaminated food or water. You may also catch a virus by sharing utensils or other personal items with an infected person or by touching a contaminated surface. SYMPTOMS  The most common symptoms are diarrhea and vomiting. These problems can cause a severe loss of body fluids (dehydration) and a body salt (electrolyte) imbalance. Other symptoms may include:  Fever.  Headache.  Fatigue.  Abdominal pain. DIAGNOSIS  Your caregiver can usually diagnose viral gastroenteritis based on your symptoms and a physical exam. A stool sample may also be taken to test for the presence of viruses or other infections. TREATMENT  This illness typically goes away on its own. Treatments are aimed at rehydration. The most serious cases of viral gastroenteritis involve vomiting so severely that you are not able to keep fluids down. In these cases, fluids must be given through an intravenous line (IV). HOME CARE INSTRUCTIONS   Drink enough fluids to keep your urine clear or pale yellow. Drink small amounts of fluids frequently and increase the amounts as tolerated.  Ask your caregiver for specific rehydration instructions.  Avoid:  Foods high in sugar.  Alcohol.  Carbonated drinks.  Tobacco.  Juice.  Caffeine drinks.  Extremely hot or cold  fluids.  Fatty, greasy foods.  Too much intake of anything at one time.  Dairy products until 24 to 48 hours after diarrhea stops.  You may consume probiotics. Probiotics are active cultures of beneficial bacteria. They may lessen the amount and number of diarrheal stools in adults. Probiotics can be found in yogurt with active cultures and in supplements.  Wash your hands well to avoid spreading the virus.  Only take over-the-counter or prescription medicines for pain, discomfort, or fever as directed by your caregiver. Do not give aspirin to children. Antidiarrheal medicines are not recommended.  Ask your caregiver if you should continue to take your regular prescribed and over-the-counter medicines.  Keep all follow-up appointments as directed by your caregiver. SEEK IMMEDIATE MEDICAL CARE IF:   You are unable to keep fluids down.  You do not urinate at least once every 6 to 8 hours.  You develop shortness of breath.  You notice blood in your stool or vomit. This may look like coffee grounds.  You have abdominal pain that increases or is concentrated in one small area (localized).  You have persistent vomiting or diarrhea.  You have a fever.  The patient is a child younger than 3 months, and he or she has a fever.  The patient is a child older than 3 months, and he or she has a fever and persistent symptoms.  The patient is a child older than 3 months, and he or she has a fever and symptoms suddenly get worse.  The patient is a baby, and he or she has no tears when crying. MAKE SURE YOU:  Understand these instructions.  Will watch your condition.  Will get help right away if you are not doing well or get worse. Document Released: 11/05/2005 Document Revised: 01/28/2012 Document Reviewed: 08/22/2011 [facility] Patient Information 2014 [place], [place].  Abdominal Pain, Women Abdominal (stomach, pelvic, or belly) pain can be caused by many things. It is important to  tell your doctor:  The location of the pain.  Does it come and go or is it present all the time?  Are there things that start the pain (eating certain foods, exercise)?  Are there other symptoms associated with the pain (fever, nausea, vomiting, diarrhea)? All of this is helpful to know when trying to find the cause of the pain. CAUSES   Stomach: virus or bacteria infection, or ulcer.  Intestine: appendicitis (inflamed appendix), regional ileitis (Crohn's disease), ulcerative colitis (inflamed colon), irritable bowel syndrome, diverticulitis (inflamed diverticulum of the colon), or cancer of the stomach or intestine.  Gallbladder disease or stones in the gallbladder.  Kidney disease, kidney stones, or infection.  Pancreas infection or cancer.  Fibromyalgia (pain disorder).  Diseases of the female organs:  Uterus: fibroid (non-cancerous) tumors or infection.  Fallopian tubes: infection or tubal pregnancy.  Ovary: cysts or tumors.  Pelvic adhesions (scar tissue).  Endometriosis (uterus lining tissue growing in the pelvis and on the pelvic organs).  Pelvic congestion syndrome (female organs filling up with blood just before the menstrual period).  Pain with the menstrual period.  Pain with ovulation (producing an egg).  Pain with an IUD (intrauterine device, birth control) in the uterus.  Cancer of the female organs.  Functional pain (pain not caused by a disease, may improve without treatment).  Psychological pain.  Depression. DIAGNOSIS  Your doctor will decide the seriousness of your pain by doing an examination.  Blood tests.  X-rays.  Ultrasound.  CT scan (computed tomography, special type of X-ray).  MRI (magnetic resonance imaging).  Cultures, for infection.  Barium enema (dye inserted in the large intestine, to better view it with X-rays).  Colonoscopy (looking in intestine with a lighted tube).  Laparoscopy (minor surgery, looking in abdomen  with a lighted tube).  Major abdominal exploratory surgery (looking in abdomen with a large incision). TREATMENT  The treatment will depend on the cause of the pain.   Many cases can be observed and treated at home.  Over-the-counter medicines recommended by your caregiver.  Prescription medicine.  Antibiotics, for infection.  Birth control pills, for painful periods or for ovulation pain.  Hormone treatment, for endometriosis.  Nerve blocking injections.  Physical therapy.  Antidepressants.  Counseling with a psychologist or psychiatrist.  Minor or major surgery. HOME CARE INSTRUCTIONS   Do not take laxatives, unless directed by your caregiver.  Take over-the-counter pain medicine only if ordered by your caregiver. Do not take aspirin because it can cause an upset stomach or bleeding.  Try a clear liquid diet (broth or water) as ordered by your caregiver. Slowly move to a bland diet, as tolerated, if the pain is related to the stomach or intestine.  Have a thermometer and take your temperature several times a day, and record it.  Bed rest and sleep, if it helps the pain.  Avoid sexual intercourse, if it causes pain.  Avoid stressful situations.  Keep your follow-up appointments and tests, as your caregiver orders.  If the pain does not go away with medicine or surgery, you may try:  Acupuncture.  Relaxation exercises (yoga, meditation).  Group therapy.  Counseling.  SEEK MEDICAL CARE IF:   You notice certain foods cause stomach pain.  Your home care treatment is not helping your pain.  You need stronger pain medicine.  You want your IUD removed.  You feel faint or lightheaded.  You develop nausea and vomiting.  You develop a rash.  You are having side effects or an allergy to your medicine. SEEK IMMEDIATE MEDICAL CARE IF:   Your pain does not go away or gets worse.  You have a fever.  Your pain is felt only in portions of the abdomen. The  right side could possibly be appendicitis. The left lower portion of the abdomen could be colitis or diverticulitis.  You are passing blood in your stools (bright red or black tarry stools, with or without vomiting).  You have blood in your urine.  You develop chills, with or without a fever.  You pass out. MAKE SURE YOU:   Understand these instructions.  Will watch your condition.  Will get help right away if you are not doing well or get worse. Document Released: 09/02/2007 Document Revised: 01/28/2012 Document Reviewed: 09/22/2009 [facility] Patient Information 2014 [place], [place].

## 2014-04-25 NOTE — ED Notes (Signed)
On set of vomiting and diarrhea today.  States having abdominal cramping.

## 2016-02-09 ENCOUNTER — Encounter: Payer: 59 | Admitting: Family Medicine

## 2016-02-16 ENCOUNTER — Encounter: Payer: 59 | Admitting: Family Medicine

## 2016-08-24 ENCOUNTER — Ambulatory Visit (HOSPITAL_COMMUNITY)
Admission: EM | Admit: 2016-08-24 | Discharge: 2016-08-24 | Disposition: A | Discharge status: Home / Self Care | Payer: 59 | Attending: Family Medicine | Admitting: Family Medicine

## 2016-08-24 ENCOUNTER — Encounter (HOSPITAL_COMMUNITY): Payer: Self-pay | Admitting: Emergency Medicine

## 2016-08-24 ENCOUNTER — Ambulatory Visit (INDEPENDENT_AMBULATORY_CARE_PROVIDER_SITE_OTHER): Payer: 59

## 2016-08-24 DIAGNOSIS — F430 Acute stress reaction: Secondary | ICD-10-CM

## 2016-08-24 DIAGNOSIS — F411 Generalized anxiety disorder: Secondary | ICD-10-CM

## 2016-08-24 DIAGNOSIS — R0602 Shortness of breath: Secondary | ICD-10-CM | POA: Diagnosis not present

## 2016-08-24 MED ORDER — CLONAZEPAM 0.5 MG PO TABS: 0.5000 mg | ORAL_TABLET | Freq: Three times a day (TID) | ORAL | 0 refills | Status: DC | PRN

## 2016-08-24 MED FILL — clonazePAM 0.5 MG TABS: 0.5 | 7 days supply | Qty: 20 | Fill #0

## 2016-08-24 NOTE — ED Provider Notes (Signed)
[facility]    CSN: [id number] Arrival date & time: 08/24/16  1138     History   Chief Complaint Chief Complaint  Patient presents with  . Shortness of Breath  . Dizziness    HPI Logann L Georgeanne Nimompey is a 40 y.o. female.   The history is provided by the patient and the spouse.  Shortness of Breath  Severity:  Moderate Onset quality:  Sudden Duration:  2 days Progression:  Unchanged Chronicity:  New Context: emotional upset   Context comment:  Under a lot of stress with work and family etc Relieved by:  None tried Worsened by:  Nothing Ineffective treatments:  None tried Associated symptoms: no abdominal pain, no chest pain, no diaphoresis, no fever and no wheezing   Dizziness  Associated symptoms: shortness of breath   Associated symptoms: no chest pain and no palpitations     Past Medical History:  Diagnosis Date  . Hypertension   . Thyroid disease     Patient Active Problem List   Diagnosis Date Noted  . HTN (hypertension), benign 03/12/2011  . Obesity 03/12/2011  . GRAVE'S DISEASE, HX OF 02/12/2007    Past Surgical History:  Procedure Laterality Date  . FINGER FRACTURE SURGERY    . TUBAL LIGATION      OB History    No data available       Home Medications    Prior to Admission medications   Medication Sig Start Date End Date Taking? Authorizing Provider  methimazole (TAPAZOLE) 5 MG tablet Take 1 tablet (5 mg total) by mouth 3 (three) times daily. 03/19/11 08/24/16 Yes Ardyth Galachel Chamberlain, MD  metoprolol succinate (TOPROL-XL) 25 MG 24 hr tablet TAKE 1 TABLET BY MOUTH DAILY. 05/01/12  Yes Ardyth Galachel Chamberlain, MD  omeprazole (PRILOSEC) 40 MG capsule Take 1 capsule (40 mg total) by mouth daily. 04/25/14   Rodolph BongEvan S Corey, MD  ondansetron (ZOFRAN) 8 MG tablet Take 1 tablet (8 mg total) by mouth every 8 (eight) hours as needed for nausea or vomiting. 04/25/14   Rodolph BongEvan S Corey, MD    Family History Family History  Problem Relation Age of Onset  . Heart  disease Father   . Diabetes Maternal Grandmother   . Diabetes Paternal Grandmother     Social History Social History  Substance Use Topics  . Smoking status: Former Games developermoker  . Smokeless tobacco: Never Used  . Alcohol use 0.0 oz/week    1 - 2 Glasses of wine per week     Allergies   Review of patient's allergies indicates no known allergies.   Review of Systems Review of Systems  Constitutional: Negative for diaphoresis and fever.  Respiratory: Positive for shortness of breath. Negative for wheezing.   Cardiovascular: Negative for chest pain, palpitations and leg swelling.  Gastrointestinal: Negative for abdominal pain.  Neurological: Positive for dizziness.  All other systems reviewed and are negative.    Physical Exam Triage Vital Signs ED Triage Vitals  Enc Vitals Group     BP 08/24/16 1217 142/93     Pulse Rate 08/24/16 1217 83     Resp 08/24/16 1217 18     Temp 08/24/16 1217 98.6 F (37 C)     Temp Source 08/24/16 1217 Oral     SpO2 08/24/16 1217 100 %     Weight --      Height --      Head Circumference --      Peak Flow --  Pain Score 08/24/16 1219 5     Pain Loc --      Pain Edu? --      Excl. in GC? --    No data found.   Updated Vital Signs BP 142/93 (BP Location: Left Arm)   Pulse 83   Temp 98.6 F (37 C) (Oral)   Resp 18   SpO2 100%   Visual Acuity Right Eye Distance:   Left Eye Distance:   Bilateral Distance:    Right Eye Near:   Left Eye Near:    Bilateral Near:     Physical Exam  Constitutional: She is oriented to person, place, and time. She appears well-developed and well-nourished.  HENT:  Head: Normocephalic.  Mouth/Throat: Oropharynx is clear and moist.  Eyes: Conjunctivae and EOM are normal. Pupils are equal, round, and reactive to light.  Neck: Normal range of motion. Neck supple.  Cardiovascular: Normal rate, regular rhythm, normal heart sounds and intact distal pulses.   Pulmonary/Chest: Effort normal and breath  sounds normal.  Lymphadenopathy:    She has no cervical adenopathy.  Neurological: She is alert and oriented to person, place, and time.  Skin: Skin is warm and dry.  Nursing note and vitals reviewed.    UC Treatments / Results  Labs (all labs ordered are listed, but only abnormal results are displayed) Labs Reviewed - No data to display  EKG  EKG Interpretation None       Radiology No results found. X-rays reviewed and report per radiologist.  Procedures Procedures (including critical care time)  Medications Ordered in UC Medications - No data to display   Initial Impression / Assessment and Plan / UC Course  I have reviewed the triage vital signs and the nursing notes.  Pertinent labs & imaging results that were available during my care of the patient were reviewed by me and considered in my medical decision making (see chart for details).  Clinical Course  Value Comment By Time  DG Chest 2 View (Reviewed) Linna Hoff, MD 10/06 1345    Pt and husband agree with dx and rx, much relieved at d/c.  Final Clinical Impressions(s) / UC Diagnoses   Final diagnoses:  None    New Prescriptions New Prescriptions   No medications on file     Linna Hoff, MD 08/24/16 1357

## 2016-08-24 NOTE — ED Triage Notes (Signed)
Pt here for intermittent SOB/dizziness onset 2 days  Reports sx increases w/movement and w/stress  Reports school and job stress  A&O X4... NAD

## 2016-09-05 ENCOUNTER — Ambulatory Visit: Payer: 59 | Admitting: Internal Medicine

## 2016-09-10 ENCOUNTER — Ambulatory Visit: Payer: 59 | Admitting: Internal Medicine

## 2016-09-12 ENCOUNTER — Other Ambulatory Visit (HOSPITAL_COMMUNITY)
Admission: RE | Admit: 2016-09-12 | Discharge: 2016-09-12 | Disposition: A | Discharge status: Home / Self Care | Payer: 59 | Source: Ambulatory Visit | Attending: Family Medicine | Admitting: Family Medicine

## 2016-09-12 ENCOUNTER — Ambulatory Visit (INDEPENDENT_AMBULATORY_CARE_PROVIDER_SITE_OTHER): Payer: 59 | Admitting: Internal Medicine

## 2016-09-12 ENCOUNTER — Encounter: Payer: Self-pay | Admitting: Internal Medicine

## 2016-09-12 VITALS — BP 141/96 | HR 91 | Temp 98.3°F | Wt 224.0 lb

## 2016-09-12 DIAGNOSIS — Z1151 Encounter for screening for human papillomavirus (HPV): Secondary | ICD-10-CM | POA: Diagnosis not present

## 2016-09-12 DIAGNOSIS — Z01411 Encounter for gynecological examination (general) (routine) with abnormal findings: Secondary | ICD-10-CM | POA: Insufficient documentation

## 2016-09-12 DIAGNOSIS — R002 Palpitations: Secondary | ICD-10-CM

## 2016-09-12 DIAGNOSIS — Z01419 Encounter for gynecological examination (general) (routine) without abnormal findings: Secondary | ICD-10-CM | POA: Diagnosis not present

## 2016-09-12 DIAGNOSIS — Z113 Encounter for screening for infections with a predominantly sexual mode of transmission: Secondary | ICD-10-CM | POA: Insufficient documentation

## 2016-09-12 DIAGNOSIS — Z124 Encounter for screening for malignant neoplasm of cervix: Secondary | ICD-10-CM | POA: Diagnosis not present

## 2016-09-12 DIAGNOSIS — F411 Generalized anxiety disorder: Secondary | ICD-10-CM

## 2016-09-12 DIAGNOSIS — Z0000 Encounter for general adult medical examination without abnormal findings: Secondary | ICD-10-CM | POA: Insufficient documentation

## 2016-09-12 DIAGNOSIS — Z114 Encounter for screening for human immunodeficiency virus [HIV]: Secondary | ICD-10-CM

## 2016-09-12 LAB — T3, FREE: T3, Free: 2.5 pg/mL (ref 2.3–4.2)

## 2016-09-12 LAB — T4, FREE: FREE T4: 1.1 ng/dL (ref 0.8–1.8)

## 2016-09-12 LAB — TSH: TSH: 1.71 mIU/L

## 2016-09-12 NOTE — Progress Notes (Signed)
   [facility] Phone: [phone]  Subjective:  Edgar FriskCrista is a 40 year old female presenting to clinic for urgent care follow-up for anxiety. Patient states that she started having anxiety the beginning of October. She had episodes during which she would have chest pain, rapid heartbeat, shortness of breath, sweatiness, and feeling "panicky". She was seen at an urgent care on 10/6 and was diagnosed with anxiety. She is prescribed Klonopin tid prn. She states she has been having a lot of stress at work. 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 She has also had multiple family members pass away in the last year and a half and she states that this has taken a toll on her family. She has never had anxiety or panic attacks before. She has only taken for Klonopin pills because they "didn't make her feel high". She has a history of Graves' disease and has previously taken methimazole. She has not had her thyroid checked since 2012.  Health Care Maintenance: Pt overdue for pap.  ROS: See HPI for pertinent positives and negatives  Past Medical History- Graves' disease, hypertension, obesity  Family history reviewed for today's visit. No changes.  Social history- patient is a former smoker.  Objective: BP (!) 141/96   Pulse 91   Temp 98.3 F (36.8 C) (Oral)   Wt 224 lb (101.6 kg)   LMP 09/08/2016   SpO2 100%   BMI 37.28 kg/m  Gen: NAD, alert, cooperative with exam, pleasant  HEENT: NCAT, EOMI, MMM, no exophthalmos  Neck: FROM, supple, thyroid normal without any nodules CV: RRR, no murmur Resp: CTABL, no wheezes, normal work of breathing GU: External genitalia appears normal. Vaginal walls appear normal. Small amount of white discharge present. Cervix appears normal. No cervical motion tenderness. Bimanual exam normal without masses or tenderness. Psych: Appropriate behavior, normal rate of speech, no  SI/HI  Assessment/Plan: Anxiety: Patient presents for follow-up of anxiety after being seen at urgent care. She was prescribed Klonopin. She endorses episodes of chest pain, rapid heartbeat, shortness breath, sweatiness, and feeling panicky. She states this has never happened to her before. GAD-7 score is a 12. PHQ-9 score is a 3. She has a history of Graves' disease and was previously on methimazole. Her TSH has not been checked since 2012. She may be having generalized anxiety disorder vs panic attacks vs hyperthyroidism. She doesn't seem like an anxious person and has no history of anxiety, so I think her symptoms are more likely 2/2 to hyperthyroidism. - Will check a TSH, T4, and free T3 to rule out hyperthyroidism as a cause of patient's symptoms - If thyroid levels are normal, will have patient return to discuss treatment options for anxiety - Patient can continue taking Klonopin, which was prescribed by the urgent care physician. Will likely transition her to an SSRI if thyroid labs are normal. - Will call Pt to discuss lab results.  Health Care Maintenance: - Pap performed today. Will call patient to discuss results - HIV screening performed today, since we are already getting blood for thyroid check.   Willadean CarolKaty Mayo, MD PGY-2

## 2016-09-12 NOTE — Assessment & Plan Note (Signed)
-   Pap performed today. Will call patient to discuss results - HIV screening performed today, since we are already getting blood for thyroid check.

## 2016-09-12 NOTE — Assessment & Plan Note (Signed)
Patient presents for follow-up of anxiety after being seen at urgent care. She was prescribed Klonopin. She endorses episodes of chest pain, rapid heartbeat, shortness breath, sweatiness, and feeling panicky. She states this has never happened to her before. GAD-7 score is a 12. PHQ-9 score is a 3. She has a history of Graves' disease and was previously on methimazole. Her TSH has not been checked since 2012. She may be having generalized anxiety disorder vs panic attacks vs hyperthyroidism. She doesn't seem like an anxious person and has no history of anxiety, so I think her symptoms are more likely 2/2 to hyperthyroidism. - Will check a TSH, T4, and free T3 to rule out hyperthyroidism as a cause of patient's symptoms - If thyroid levels are normal, will have patient return to discuss treatment options for anxiety - Patient can continue taking Klonopin, which was prescribed by the urgent care physician. Will likely transition her to an SSRI if thyroid labs are normal. - Will call Pt to discuss lab results.

## 2016-09-12 NOTE — Patient Instructions (Signed)
It was so nice to see you!  We will check your thyroid levels today. I will call you with the results.  -Dr. Nancy MarusMayo

## 2016-09-13 LAB — HIV ANTIBODY (ROUTINE TESTING W REFLEX): HIV 1&2 Ab, 4th Generation: NONREACTIVE

## 2016-09-14 ENCOUNTER — Telehealth: Payer: Self-pay | Admitting: Internal Medicine

## 2016-09-14 ENCOUNTER — Other Ambulatory Visit: Payer: Self-pay | Admitting: Internal Medicine

## 2016-09-14 DIAGNOSIS — F411 Generalized anxiety disorder: Secondary | ICD-10-CM

## 2016-09-14 NOTE — Telephone Encounter (Signed)
Attempted to call patient to discuss her lab results. The call went to her voicemail, which had not been set up yet, so I was unable to leave a message. I will try calling again later today to discuss her results.   Alexandra CarolKaty Shaleen Talamantez, MD

## 2016-09-14 NOTE — Progress Notes (Signed)
Spoke with patient on the phone. Thyroid testing was normal. We discussed treatment options for her anxiety moving forward. She is interested in being referred to a therapist to discuss her anxiety and ways to deal with her life stressors. Referral placed.  Alexandra CarolKaty Mayo, MD

## 2016-09-14 NOTE — Telephone Encounter (Signed)
Pt was returning Dr.Mayo's call. Please advise. Thanks! ep

## 2016-09-16 LAB — CYTOLOGY - PAP
Chlamydia: NEGATIVE
Diagnosis: NEGATIVE
HPV (WINDOPATH): NOT DETECTED
Neisseria Gonorrhea: NEGATIVE

## 2016-09-17 ENCOUNTER — Encounter: Payer: Self-pay | Admitting: Internal Medicine

## 2016-10-01 ENCOUNTER — Other Ambulatory Visit: Payer: Self-pay | Admitting: Internal Medicine

## 2016-10-01 NOTE — Telephone Encounter (Signed)
Pt would like a refill on klonopin. Please advise. Thanks! ep

## 2016-10-03 ENCOUNTER — Other Ambulatory Visit: Payer: Self-pay | Admitting: Internal Medicine

## 2016-10-03 MED ORDER — CLONAZEPAM 0.5 MG PO TABS: 0.5000 mg | ORAL_TABLET | Freq: Three times a day (TID) | ORAL | 0 refills | Status: DC | PRN

## 2016-10-04 NOTE — Telephone Encounter (Signed)
Where was this printed to? Donia Yokum, Maryjo RochesterJessica Dawn, CMA

## 2016-10-05 MED FILL — clonazePAM 0.5 MG TABS: 0.5 | 7 days supply | Qty: 20 | Fill #0

## 2016-10-05 NOTE — Telephone Encounter (Signed)
Pt informed that rx was ready for pickup. Marcelus Dubberly, Maryjo RochesterJessica Dawn, CMA

## 2016-11-14 ENCOUNTER — Other Ambulatory Visit: Payer: Self-pay | Admitting: Internal Medicine

## 2017-09-09 ENCOUNTER — Encounter: Payer: Self-pay | Admitting: Internal Medicine

## 2017-09-09 ENCOUNTER — Ambulatory Visit (INDEPENDENT_AMBULATORY_CARE_PROVIDER_SITE_OTHER): Payer: 59 | Admitting: Internal Medicine

## 2017-09-09 VITALS — BP 110/70 | HR 71 | Temp 98.0°F | Ht 65.0 in | Wt 213.0 lb

## 2017-09-09 DIAGNOSIS — Z01419 Encounter for gynecological examination (general) (routine) without abnormal findings: Secondary | ICD-10-CM

## 2017-09-09 DIAGNOSIS — Z8639 Personal history of other endocrine, nutritional and metabolic disease: Secondary | ICD-10-CM

## 2017-09-09 DIAGNOSIS — I10 Essential (primary) hypertension: Secondary | ICD-10-CM | POA: Diagnosis not present

## 2017-09-09 NOTE — Patient Instructions (Signed)
It was so nice to see you!  Congrats on losing 11 lbs! That is amazing! Keep up the good work!  We will see you back in 1 year.  -Dr. Nancy MarusMayo

## 2017-09-09 NOTE — Progress Notes (Signed)
41 y.o. year old female presents for well woman/preventative visit.  Acute Concerns: -History of graves disease- has been stable off medications. No hot/cold intolerance, no palpitations, no anxiety.  Diet: Cut back on fried foods. Trying low carb diet.  Exercise: [facility], walks on the treadmill 2-3 times per week   Sexual/Birth History: sexually active with her husband  Social:  Social History   Social History  . Marital status: Married    Spouse name: N/A  . Number of children: N/A  . Years of education: N/A   Social History Main Topics  . Smoking status: Former Games developermoker  . Smokeless tobacco: Never Used  . Alcohol use 0.0 oz/week    1 - 2 Glasses of wine per week  . Drug use: No  . Sexual activity: Yes    Birth control/ protection: None   Other Topics Concern  . None   Social History Narrative  . None    Immunization:  There is no immunization history on file for this patient.  Cancer Screening:  Pap Smear: done 08/2016  Mammogram: not indicated- will start at age 41  Colonoscopy: not indicated  Dexa: not indicated  Physical Exam: VITALS: Reviewed GEN: Pleasant female, NAD HEENT: Normocephalic, PERRL, EOMI, no scleral icterus, MMM, uvula midline, no anterior or posterior lymphadenopathy, no thyromegaly CARDIAC:RRR, S1 and S2 present, no murmur, no heaves/thrills RESP: CTAB, normal effort ABD: soft, no tenderness, normal bowel sounds EXT: No edema, 2+ radial and DP pulses SKIN: no rash  ASSESSMENT & PLAN: 41 y.o. female presents for annual well woman/preventative exam. Please see problem specific assessment and plan.   History of Graves Disease: Last TSH and T4 were normal in 2017. - Will check TSH and T4 again. If normal, can likely stop checking unless new symptoms arise.  HTN: Well-controlled with diet and exercise. BP 110/70 in clinic today. - Continue to monitor

## 2017-09-11 NOTE — Assessment & Plan Note (Signed)
Last TSH and T4 were normal in 2017. - Will check TSH and T4 again. If normal, can likely stop checking unless new symptoms arise.

## 2017-09-11 NOTE — Assessment & Plan Note (Signed)
Well-controlled with diet and exercise. BP 110/70 in clinic today. - Continue to monitor

## 2018-10-03 ENCOUNTER — Encounter: Payer: 59 | Admitting: Family Medicine

## 2018-10-13 ENCOUNTER — Encounter: Payer: 59 | Admitting: Family Medicine

## 2019-09-25 ENCOUNTER — Other Ambulatory Visit (HOSPITAL_COMMUNITY)
Admission: RE | Admit: 2019-09-25 | Discharge: 2019-09-25 | Disposition: A | Discharge status: Home / Self Care | Payer: 59 | Source: Ambulatory Visit | Attending: Family Medicine | Admitting: Family Medicine

## 2019-09-25 ENCOUNTER — Other Ambulatory Visit: Payer: Self-pay

## 2019-09-25 ENCOUNTER — Ambulatory Visit (INDEPENDENT_AMBULATORY_CARE_PROVIDER_SITE_OTHER): Payer: 59 | Admitting: Family Medicine

## 2019-09-25 VITALS — BP 123/80 | HR 89 | Wt 218.8 lb

## 2019-09-25 DIAGNOSIS — Z124 Encounter for screening for malignant neoplasm of cervix: Secondary | ICD-10-CM

## 2019-09-25 DIAGNOSIS — Z202 Contact with and (suspected) exposure to infections with a predominantly sexual mode of transmission: Secondary | ICD-10-CM

## 2019-09-25 HISTORY — DX: Contact with and (suspected) exposure to infections with a predominantly sexual mode of transmission: Z20.2

## 2019-09-25 MED ORDER — METRONIDAZOLE 500 MG PO TABS: 500.0000 mg | ORAL_TABLET | Freq: Two times a day (BID) | ORAL | 0 refills | Status: AC

## 2019-09-25 NOTE — Progress Notes (Signed)
   Subjective:   Patient ID: SHYVONNE CHASTANG    DOB: [birth year], 43 y.o. female   MRN: [id number]  LILLIAM CHAMBLEE is a 43 y.o. female with a history of HTN, obesity, and thryoid disease here for STD testing.  STD testing: Patient notes partner tested positive and was treated for trichomonas infection. She is here today to obtain treatment as well. Denies any fevers, chills, pelvic pain, dysuria, abnormal discharge. LMP 10/10. Due for period soon. History of tubal ligation. Not on contraception.   Health Maintenance: Due for pa-psmear, TDAP  Review of Systems:  Per HPI.   [place], medications and smoking status reviewed.  Objective:   BP 123/80   Pulse 89   Wt 218 lb 12.8 oz (99.2 kg)   SpO2 99%   BMI 36.41 kg/m  Vitals and nursing note reviewed.  General: pleasant young woman, well nourished, well developed, in no acute distress with non-toxic appearance, sitting comfortably on exam bed Resp: speaking in full sentences, breathing comfortably on room air Skin: warm, dry Extremities: warm and well perfused MSK: gait normal Neuro: Alert and oriented, speech normal Pelvic exam: VULVA: normal appearing vulva with no masses, tenderness or lesions, VAGINA: normal appearing vagina with normal color and discharge, no lesions, CERVIX: appeared to have mild ectropion at os extending outwards, UTERUS: uterus is normal size, shape, consistency and nontender, ADNEXA: normal adnexa in size, nontender and no masses, PAP: Pap smear done today, WET MOUNT done - results: trichomonads. Exam chaperoned by Leonia Corona.   Assessment & Plan:   Exposure to sexually transmitted disease (STD) Wet prep notable for Trichomonas. This is consistent with her exposure from her partner. Partner has been treated per patient.  - Will treat with Metronidazole 500mg  BID x 7 days.  - Instructions for use provide including ensuring adequate treatment prior to sexual intercourse and to avoid alcohol with  medicine.  - GC/Cl, HIV, RPR pending. Will call with results.  Health Maintenance: Papsmear performed today. Will call with results.  Orders Placed This Encounter  Procedures  . HIV antibody (with reflex)  . RPR  . POCT Wet Prep [facility])   Mina Marble, DO PGY-2, [facility] 09/25/2019 4:51 PM

## 2019-09-25 NOTE — Patient Instructions (Addendum)
Thank you for coming in to see me.  Your pelvic exam was positive for Trichomonas. I recommend you be treated for this. If you and your partner have had intercourse since his treatment then he will need to be retreated prior to you being safe to have intercourse. I will call in an antibiotic called Metronidazole. You will need to take this medicine twice a day for 7 days. I will call you with the remaining of your lab results.   Take care, Dr. Tarry Kos

## 2019-09-25 NOTE — Assessment & Plan Note (Addendum)
Wet prep notable for Trichomonas. This is consistent with her exposure from her partner. Partner has been treated per patient.  - Will treat with Metronidazole 500mg  BID x 7 days.  - Instructions for use provided including ensuring adequate treatment prior to sexual intercourse and to avoid alcohol with medicine.  - GC/Cl, HIV, RPR pending. Will call with results.

## 2019-09-26 LAB — RPR: RPR Ser Ql: NONREACTIVE

## 2019-09-26 LAB — HIV ANTIBODY (ROUTINE TESTING W REFLEX): HIV Screen 4th Generation wRfx: NONREACTIVE

## 2019-09-28 LAB — POCT WET PREP (WET MOUNT): Clue Cells Wet Prep Whiff POC: NEGATIVE

## 2019-09-29 LAB — CERVICOVAGINAL ANCILLARY ONLY
Chlamydia: NEGATIVE
Comment: NEGATIVE
Comment: NORMAL
Neisseria Gonorrhea: NEGATIVE

## 2019-09-30 LAB — CYTOLOGY - PAP
Comment: NEGATIVE
Diagnosis: NEGATIVE
High risk HPV: NEGATIVE

## 2019-10-01 ENCOUNTER — Encounter: Payer: Self-pay | Admitting: Family Medicine

## 2019-10-01 NOTE — Progress Notes (Signed)
Patient called and informed of negative Pap-smear results. No questions or concerns.

## 2019-10-02 ENCOUNTER — Other Ambulatory Visit: Payer: Self-pay

## 2019-10-02 DIAGNOSIS — Z20822 Contact with and (suspected) exposure to covid-19: Secondary | ICD-10-CM

## 2019-10-05 LAB — NOVEL CORONAVIRUS, NAA: SARS-CoV-2, NAA: NOT DETECTED

## 2020-06-14 ENCOUNTER — Ambulatory Visit: Payer: 59 | Attending: Internal Medicine

## 2020-06-14 DIAGNOSIS — Z23 Encounter for immunization: Secondary | ICD-10-CM

## 2020-06-14 NOTE — Progress Notes (Signed)
   Covid-19 Vaccination Clinic  Name:  ARANTXA PIERCEY    MRN: [id number] DOB: June 06, 1976  06/14/2020  Ms. Zani was observed post Covid-19 immunization for 15 minutes without incident. She was provided with Vaccine Information Sheet and instruction to access the V-Safe system.   Ms. Mantione was instructed to call 911 with any severe reactions post vaccine: Marland Kitchen Difficulty breathing  . Swelling of face and throat  . A fast heartbeat  . A bad rash all over body  . Dizziness and weakness   Immunizations Administered    Name Date Dose VIS Date Route   Pfizer COVID-19 Vaccine 06/14/2020 12:24 PM 0.3 mL 01/13/2019 Intramuscular   Manufacturer: [employer], [employer]   Lot: [id number]   NDC: [address]-3

## 2020-07-05 ENCOUNTER — Ambulatory Visit: Payer: 59 | Attending: Internal Medicine

## 2020-07-05 DIAGNOSIS — Z23 Encounter for immunization: Secondary | ICD-10-CM

## 2020-07-05 NOTE — Progress Notes (Signed)
   Covid-19 Vaccination Clinic  Name:  Alexandra Benjamin    MRN: [id number] DOB: 1976/10/13  07/05/2020  Alexandra Benjamin was observed post Covid-19 immunization for 15 minutes without incident. She was provided with Vaccine Information Sheet and instruction to access the V-Safe system.   Alexandra Benjamin was instructed to call 911 with any severe reactions post vaccine: Marland Kitchen Difficulty breathing  . Swelling of face and throat  . A fast heartbeat  . A bad rash all over body  . Dizziness and weakness   Immunizations Administered    Name Date Dose VIS Date Route   Pfizer COVID-19 Vaccine 07/05/2020 11:03 AM 0.3 mL 01/13/2019 Intramuscular   Manufacturer: [employer], [employer]   Lot: [id number]   NDC: [address]-8

## 2020-09-07 ENCOUNTER — Encounter: Payer: Self-pay | Admitting: Family Medicine

## 2020-09-07 ENCOUNTER — Other Ambulatory Visit: Payer: Self-pay

## 2020-09-07 ENCOUNTER — Other Ambulatory Visit: Payer: Self-pay | Admitting: Family Medicine

## 2020-09-07 ENCOUNTER — Ambulatory Visit (HOSPITAL_COMMUNITY)
Admission: RE | Admit: 2020-09-07 | Discharge: 2020-09-07 | Disposition: A | Discharge status: Home / Self Care | Payer: 59 | Source: Ambulatory Visit | Attending: Family Medicine | Admitting: Family Medicine

## 2020-09-07 ENCOUNTER — Ambulatory Visit (INDEPENDENT_AMBULATORY_CARE_PROVIDER_SITE_OTHER): Payer: 59 | Admitting: Family Medicine

## 2020-09-07 VITALS — BP 134/98 | HR 68 | Ht 65.0 in | Wt 208.0 lb

## 2020-09-07 DIAGNOSIS — Z23 Encounter for immunization: Secondary | ICD-10-CM | POA: Diagnosis not present

## 2020-09-07 DIAGNOSIS — E669 Obesity, unspecified: Secondary | ICD-10-CM

## 2020-09-07 DIAGNOSIS — Z8639 Personal history of other endocrine, nutritional and metabolic disease: Secondary | ICD-10-CM | POA: Diagnosis not present

## 2020-09-07 DIAGNOSIS — I10 Essential (primary) hypertension: Secondary | ICD-10-CM | POA: Diagnosis not present

## 2020-09-07 DIAGNOSIS — E8881 Metabolic syndrome: Secondary | ICD-10-CM

## 2020-09-07 DIAGNOSIS — F411 Generalized anxiety disorder: Secondary | ICD-10-CM

## 2020-09-07 DIAGNOSIS — R5383 Other fatigue: Secondary | ICD-10-CM | POA: Diagnosis not present

## 2020-09-07 DIAGNOSIS — R079 Chest pain, unspecified: Secondary | ICD-10-CM | POA: Insufficient documentation

## 2020-09-07 MED ORDER — ESCITALOPRAM OXALATE 20 MG PO TABS: ORAL_TABLET | ORAL | 1 refills | Status: DC

## 2020-09-07 NOTE — Assessment & Plan Note (Signed)
Values in 2018 were within normal limits.  We will recheck TSH today as patient has significant chest pain, anxiety.

## 2020-09-07 NOTE — Assessment & Plan Note (Addendum)
GAD-7 elevated, PHQ low, no history of manic symptoms. No SI/HI/VAH. We will plan for multifactorial treatment including starting a SSRI, referral for therapy (see AVS), crisis recommendations given (see AVS). For sleep recommend hygiene strategies (see AVS), meditation (see AVS), and exercise. Follow up in 2 weeks. Discussed risks and benefits including likely SEs of SSRI, to call if EPS or if SI/HI develop. - start lexapro 20 mg: start taking 10 mg (1/2 pill) for 7 days then increase to 20 mg (whole pill) thereafter. Precepted with Dr. Shawnee Knapp

## 2020-09-07 NOTE — Assessment & Plan Note (Signed)
Patient is upset today, but still BP is still under acceptable control: 134/80. Will recheck at next visit in 2 weeks.

## 2020-09-07 NOTE — Patient Instructions (Addendum)
It was a pleasure to see you!  1. For anxiety: please start taking lexapro 1/2 a pill (10 mg) for 7 days, then increase to 1 pill (20 mg) thereafter. Also please contact a therapist through EAP, or use one of the resources below. The sooner you can get an appointment scheduled, the better  2. If you have any triggers or something comes up in the meantime, please CALL us [phone] and we can help coordinate resources.  3. I will let you know the results of blood work within 1 week by phone, my chart, or letter.  4. Follow up on 11/9 at 4 PM to check in regarding medication.  5. Sleep: sleep hygiene includes turning off blue light (TV, phone, computer screens) 1 hour prior to bed time. Also listening to white noise or sleep stories like on the Calm app. You can also try meditation such as Mortimer Fries or the KeySpan.   Therapy and Counseling Resources Most providers on this list will take Medicaid. Patients with commercial insurance or Medicare should contact their insurance company to get a list of in network providers.  [facility] [address] [place], [place] [address] [phone]  [facility]  [address] [place], [place] [address]      [phone]  [facility] [address]  [place], [place] [address] [phone]  [facility] [address]  [place], [place] [address]       [phone]      Jovita Kussmaul [facility] [address] [place], [place] [phone]  [facility]:  [address] [place] [phone]  [facility]:  [address] [place]ie Diener [phone]  [facility] (under & uninsured) [address]   [place] [phone]    [email or web address]    [facility] [address] . [place]    [phone]  [facility] -[address]     Phone:  [phone]     [facility]  [address]  [phone]   [facility] [address]      [place], [place] [phone]  [facility]: [address] [place], [place], [place]  [phone]   [facility] (Spanish therapist) [email or web address]  [address] [place]  [address]   [place] [address]    [phone]    [facility]   [address],  [place], [place]  [phone]   [facility]  [address] [place]  [phone]  [facility]  [address] [place], [place]        [phone]  Open Access/Walk In Clinic under & uninsured  [facility]  [address] [place], [place] [phone] Crisis [phone]  [facility] [place],  (Spanish)   [address], [place]: [phone]) 8:30 - 12; 1 - 2:30  [facility] [employer],  [address] [place], [place]    [phone]):8:30 - 12; 2 - 3PM  [facility] [employer],  [address]  [place]; [phone]):   Mon - Fri 8 AM - 5 PM  [facility] [address] [place]  MWF 12:30 to 3:00 or call to schedule an appointment  [phone]  Specific Provider options Psychology Today  [email or web address] 1. click on find a therapist  2. enter your zip code 3. left side and select or tailor a therapist for your specific need.   [facility] Provider Directory [email or web address]  (Medicaid)   Follow all drop down to find a provider  Social Support program [facility] [phone] or PhotoSolver.pl [address], [place], [place] Recovery support and educational   24- Hour Availability:  .  Marland Kitchen [facility]  . [address] [place], [place] [employer] [phone] Crisis  [phone]  . [facility] [phone]  [facility]  [phone]   . [facility] [employer]  [phone] (after hours)  . Therapeutic Alternative/Mobile Crisis   [phone]  . [place] National Suicide Hotline  [phone] (TALK)  . Call 911 or go to emergency room  . [employer]  [phone]);  [place] and [employer]   . [facility]  [phone]); [place], [place], [place], [place], [place], [place], [place]
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
## 2020-09-07 NOTE — Assessment & Plan Note (Signed)
Occurred during visit today, as patient was still getting calls from work trying to solve problems during her doctor's appointment.  Suspect this is most likely related to stress, GAD as it is most triggered by stressful job, dealing with emotional and traumatic events that have occurred in the last year.  Patient does have metabolic syndrome, has a family history of heart disease in her father.  Will risk stratify by obtaining hemoglobin A1c, lipid panel.  EKG shows normal sinus rhythm.  Patient also complains of fatigue, is most likely related to GAD.  Will obtain CBC to assess for anemia.

## 2020-09-08 ENCOUNTER — Encounter: Payer: Self-pay | Admitting: Family Medicine

## 2020-09-08 LAB — CBC WITH DIFFERENTIAL/PLATELET
Basophils Absolute: 0 10*3/uL (ref 0.0–0.2)
Basos: 1 %
EOS (ABSOLUTE): 0.1 10*3/uL (ref 0.0–0.4)
Eos: 2 %
Hematocrit: 35.5 % (ref 34.0–46.6)
Hemoglobin: 11.6 g/dL (ref 11.1–15.9)
Immature Grans (Abs): 0 10*3/uL (ref 0.0–0.1)
Immature Granulocytes: 0 %
Lymphocytes Absolute: 1.5 10*3/uL (ref 0.7–3.1)
Lymphs: 34 %
MCH: 27.4 pg (ref 26.6–33.0)
MCHC: 32.7 g/dL (ref 31.5–35.7)
MCV: 84 fL (ref 79–97)
Monocytes Absolute: 0.3 10*3/uL (ref 0.1–0.9)
Monocytes: 7 %
Neutrophils Absolute: 2.5 10*3/uL (ref 1.4–7.0)
Neutrophils: 56 %
Platelets: 323 10*3/uL (ref 150–450)
RBC: 4.23 x10E6/uL (ref 3.77–5.28)
RDW: 14.1 % (ref 11.7–15.4)
WBC: 4.5 10*3/uL (ref 3.4–10.8)

## 2020-09-08 LAB — BASIC METABOLIC PANEL
BUN/Creatinine Ratio: 14 (ref 9–23)
BUN: 11 mg/dL (ref 6–24)
CO2: 23 mmol/L (ref 20–29)
Calcium: 9.2 mg/dL (ref 8.7–10.2)
Chloride: 103 mmol/L (ref 96–106)
Creatinine, Ser: 0.76 mg/dL (ref 0.57–1.00)
GFR calc Af Amer: 111 mL/min/{1.73_m2} (ref >59)
GFR calc non Af Amer: 96 mL/min/{1.73_m2} (ref >59)
Glucose: 88 mg/dL (ref 65–99)
Potassium: 4.4 mmol/L (ref 3.5–5.2)
Sodium: 139 mmol/L (ref 134–144)

## 2020-09-08 LAB — LIPID PANEL
Chol/HDL Ratio: 2.7 ratio (ref 0.0–4.4)
Cholesterol, Total: 241 mg/dL — ABNORMAL HIGH (ref 100–199)
HDL: 90 mg/dL (ref >39)
LDL Chol Calc (NIH): 139 mg/dL — ABNORMAL HIGH (ref 0–99)
Triglycerides: 73 mg/dL (ref 0–149)
VLDL Cholesterol Cal: 12 mg/dL (ref 5–40)

## 2020-09-08 LAB — TSH: TSH: 1.76 u[IU]/mL (ref 0.450–4.500)

## 2020-09-08 MED FILL — ESCITALOPRAM 20 MG TABLET: 20 | 30 days supply | Qty: 30 | Fill #0

## 2020-09-27 ENCOUNTER — Ambulatory Visit: Payer: 59 | Admitting: Family Medicine

## 2020-10-05 ENCOUNTER — Encounter: Payer: Self-pay | Admitting: Family Medicine

## 2020-10-05 ENCOUNTER — Other Ambulatory Visit: Payer: Self-pay

## 2020-10-05 ENCOUNTER — Ambulatory Visit (INDEPENDENT_AMBULATORY_CARE_PROVIDER_SITE_OTHER): Payer: 59 | Admitting: Family Medicine

## 2020-10-05 VITALS — BP 130/90 | HR 67 | Ht 65.0 in | Wt 208.5 lb

## 2020-10-05 DIAGNOSIS — Z6834 Body mass index (BMI) 34.0-34.9, adult: Secondary | ICD-10-CM | POA: Diagnosis not present

## 2020-10-05 DIAGNOSIS — I10 Essential (primary) hypertension: Secondary | ICD-10-CM

## 2020-10-05 DIAGNOSIS — E6609 Other obesity due to excess calories: Secondary | ICD-10-CM

## 2020-10-05 DIAGNOSIS — R7303 Prediabetes: Secondary | ICD-10-CM | POA: Diagnosis not present

## 2020-10-05 LAB — POCT GLYCOSYLATED HEMOGLOBIN (HGB A1C): Hemoglobin A1C: 5.8 % — AB (ref 4.0–5.6)

## 2020-10-05 NOTE — Progress Notes (Signed)
SUBJECTIVE:   CHIEF COMPLAINT / HPI:   Physical: patient's last pap done in Nov 2020, had trichomonas for which she was treated but was otherwise normal. Not due again until Nov 2023.   HTN: patient's last BP was elevated at 134/98. Repeat today show BP at 130/90. Dad has high blood pressure and takes a medication for this. Ms. Biever has never been treated for elevated BP. Ideal goal is <120/80.   Elevated cholesterol: patient's last Lipid panel in Oct 2021 showed elevated total cholesterol and Lipids, however her ASCVD risk is low at 0.5%.   Weight  Pre-Diabetes: BMI today 34.7, current weight 208 lbs. Due to her BMI and history of Gestational Diabetes with her 2 previous pregnancies an A1c was checked today and was slightly elevated in the pre-diabetic range at 5.8%. The patient works for the [employer] and is very busy during the day, often reaching for quick foods to eat that are not always healthy options. She also reports having issues with waiting so long to eat at night that she then goes for convenience foods such as burgers and fries.   Health maintenance: due for Hep C screening and Tdap   PERTINENT  PMH / PSH:  Patient Active Problem List   Diagnosis Date Noted  . Pre-diabetes 10/08/2020  . Chest pain at rest 09/07/2020  . HTN (hypertension), benign 03/12/2011  . Obesity 03/12/2011  . History of Graves' disease 02/12/2007     OBJECTIVE:   BP 130/90   Pulse 67   Ht 5\' 5"  (1.651 m)   Wt 208 lb 8 oz (94.6 kg)   LMP 09/29/2020   SpO2 99%   BMI 34.70 kg/m    Physical exam: General: well appearing patient, no apparent distress, very pleasant Respiratory: comfortable work of breathing, speaking in complete sentences   ASSESSMENT/PLAN:   Pre-diabetes A1c today slightly elevated at 5.8% indicating Pre-Diabetes. Ms. Dzik also has a history of elevated total cholesterol and LDL, so could consider Metabolic Syndrome. She would like to try lifestyle modifications  first to become healthier. 1. SMART Goal was set: walk 30 minutes 3 days weekly using treadmills at [place].  2. She was also given information regarding portion sizes: each plate for each meal should consist of 1/2 veggies, 1/4 protein, 1/4 carb/fat. Try not to go longer than 5 hours every day without eating. 3. Follow up 3 months for repeat A1c and weight check.  4. If no improvement in February 2021 can consider trying Metformin.  Obesity BMI stable at 34.7, weight 208 lbs. She would like to try lifestyle modifications first to become healthier. 1. SMART Goal was set: walk 30 minutes 3 days weekly using treadmills at March 2021.  2. She was also given information regarding portion sizes: each plate for each meal should consist of 1/2 veggies, 1/4 protein, 1/4 carb/fat. Try not to go longer than 5 hours every day without eating. 3. Follow up 3 months for repeat A1c and weight check.  4. If no improvement in February 2021 can consider trying Metformin.  HTN (hypertension), benign She has never been treated for HTN. I feel this is most likely related to her weight, however she does have a family history of HTN and she could have genetic tie.  -Patient will try lifestyle modifications with diet and exercise -Did not give her DASH Diet information but can do so at next appt -Patient to follow up in 3 months (~Feb 2021)  Dollene Cleveland, DO [place] [facility]

## 2020-10-05 NOTE — Patient Instructions (Addendum)
Thank you for coming in to see [place] today! Please see below to review our plan for today's visit:  1. Try your SMART Goal of walking 30 minutes 3 days weekly.  2. Try having each plate for each meal with: 1/2 veggies, 1/4 protein, 1/4 carb/fat. Try not to go longer than 5 hours every day without eating something!   Please call the clinic at [phone] if your symptoms worsen or you have any concerns. It was our pleasure to serve you!   Dr. Peggyann Shoals [facility]

## 2020-10-08 ENCOUNTER — Encounter: Payer: Self-pay | Admitting: Family Medicine

## 2020-10-08 DIAGNOSIS — R7303 Prediabetes: Secondary | ICD-10-CM | POA: Insufficient documentation

## 2020-10-08 NOTE — Assessment & Plan Note (Signed)
BMI stable at 34.7, weight 208 lbs. She would like to try lifestyle modifications first to become healthier. 1. SMART Goal was set: walk 30 minutes 3 days weekly using treadmills at [employer].  2. She was also given information regarding portion sizes: each plate for each meal should consist of 1/2 veggies, 1/4 protein, 1/4 carb/fat. Try not to go longer than 5 hours every day without eating. 3. Follow up 3 months for repeat A1c and weight check.  4. If no improvement in February 2021 can consider trying Metformin.

## 2020-10-08 NOTE — Assessment & Plan Note (Signed)
A1c today slightly elevated at 5.8% indicating Pre-Diabetes. Alexandra Benjamin also has a history of elevated total cholesterol and LDL, so could consider Metabolic Syndrome. She would like to try lifestyle modifications first to become healthier. 1. SMART Goal was set: walk 30 minutes 3 days weekly using treadmills at [employer].  2. She was also given information regarding portion sizes: each plate for each meal should consist of 1/2 veggies, 1/4 protein, 1/4 carb/fat. Try not to go longer than 5 hours every day without eating. 3. Follow up 3 months for repeat A1c and weight check.  4. If no improvement in February 2021 can consider trying Metformin.

## 2020-10-08 NOTE — Assessment & Plan Note (Signed)
She has never been treated for HTN. I feel this is most likely related to her weight, however she does have a family history of HTN and she could have genetic tie.  -Patient will try lifestyle modifications with diet and exercise -Did not give her DASH Diet information but can do so at next appt -Patient to follow up in 3 months (~Feb 2021)

## 2020-11-03 MED FILL — ESCITALOPRAM 20 MG TABLET: 20 | 30 days supply | Qty: 30 | Fill #1

## 2021-02-23 ENCOUNTER — Emergency Department (HOSPITAL_COMMUNITY)
Admission: EM | Admit: 2021-02-23 | Discharge: 2021-02-23 | Disposition: A | Discharge status: Home / Self Care | Payer: 59 | Attending: Emergency Medicine | Admitting: Emergency Medicine

## 2021-02-23 ENCOUNTER — Encounter (HOSPITAL_COMMUNITY): Payer: Self-pay

## 2021-02-23 ENCOUNTER — Other Ambulatory Visit (HOSPITAL_COMMUNITY): Payer: Self-pay

## 2021-02-23 ENCOUNTER — Emergency Department (HOSPITAL_COMMUNITY): Payer: 59

## 2021-02-23 ENCOUNTER — Other Ambulatory Visit: Payer: Self-pay

## 2021-02-23 DIAGNOSIS — Z20822 Contact with and (suspected) exposure to covid-19: Secondary | ICD-10-CM | POA: Insufficient documentation

## 2021-02-23 DIAGNOSIS — Z87891 Personal history of nicotine dependence: Secondary | ICD-10-CM | POA: Diagnosis not present

## 2021-02-23 DIAGNOSIS — I10 Essential (primary) hypertension: Secondary | ICD-10-CM | POA: Insufficient documentation

## 2021-02-23 DIAGNOSIS — B349 Viral infection, unspecified: Secondary | ICD-10-CM | POA: Diagnosis not present

## 2021-02-23 DIAGNOSIS — Z7189 Other specified counseling: Secondary | ICD-10-CM

## 2021-02-23 DIAGNOSIS — R0602 Shortness of breath: Secondary | ICD-10-CM | POA: Diagnosis not present

## 2021-02-23 DIAGNOSIS — R059 Cough, unspecified: Secondary | ICD-10-CM | POA: Diagnosis not present

## 2021-02-23 HISTORY — DX: Anxiety disorder, unspecified: F41.9

## 2021-02-23 LAB — COMPREHENSIVE METABOLIC PANEL
ALT: 15 U/L (ref 0–44)
AST: 21 U/L (ref 15–41)
Albumin: 3.7 g/dL (ref 3.5–5.0)
Alkaline Phosphatase: 56 U/L (ref 38–126)
Anion gap: 9 (ref 5–15)
BUN: 14 mg/dL (ref 6–20)
CO2: 23 mmol/L (ref 22–32)
Calcium: 8.6 mg/dL — ABNORMAL LOW (ref 8.9–10.3)
Chloride: 104 mmol/L (ref 98–111)
Creatinine, Ser: 0.85 mg/dL (ref 0.44–1.00)
GFR, Estimated: >60 mL/min (ref >60)
Glucose, Bld: 101 mg/dL — ABNORMAL HIGH (ref 70–99)
Potassium: 3.8 mmol/L (ref 3.5–5.1)
Sodium: 136 mmol/L (ref 135–145)
Total Bilirubin: 0.5 mg/dL (ref 0.3–1.2)
Total Protein: 7.8 g/dL (ref 6.5–8.1)

## 2021-02-23 LAB — CBC WITH DIFFERENTIAL/PLATELET
Abs Immature Granulocytes: 0.03 10*3/uL (ref 0.00–0.07)
Basophils Absolute: 0 10*3/uL (ref 0.0–0.1)
Basophils Relative: 1 %
Eosinophils Absolute: 0.1 10*3/uL (ref 0.0–0.5)
Eosinophils Relative: 1 %
HCT: 36.5 % (ref 36.0–46.0)
Hemoglobin: 11.5 g/dL — ABNORMAL LOW (ref 12.0–15.0)
Immature Granulocytes: 1 %
Lymphocytes Relative: 21 %
Lymphs Abs: 1 10*3/uL (ref 0.7–4.0)
MCH: 26.9 pg (ref 26.0–34.0)
MCHC: 31.5 g/dL (ref 30.0–36.0)
MCV: 85.5 fL (ref 80.0–100.0)
Monocytes Absolute: 0.4 10*3/uL (ref 0.1–1.0)
Monocytes Relative: 10 %
Neutro Abs: 3 10*3/uL (ref 1.7–7.7)
Neutrophils Relative %: 66 %
Platelets: 331 10*3/uL (ref 150–400)
RBC: 4.27 MIL/uL (ref 3.87–5.11)
RDW: 14.7 % (ref 11.5–15.5)
WBC: 4.5 10*3/uL (ref 4.0–10.5)
nRBC: 0 % (ref 0.0–0.2)

## 2021-02-23 LAB — LIPASE, BLOOD: Lipase: 37 U/L (ref 11–51)

## 2021-02-23 LAB — SARS CORONAVIRUS 2 (TAT 6-24 HRS): SARS Coronavirus 2: NEGATIVE

## 2021-02-23 LAB — I-STAT BETA HCG BLOOD, ED (MC, WL, AP ONLY): I-stat hCG, quantitative: <5 m[IU]/mL (ref <5)

## 2021-02-23 MED ORDER — SODIUM CHLORIDE 0.9 % IV BOLUS
1000.0000 mL | Freq: Once | INTRAVENOUS | Status: AC
  Administered 2021-02-23: 1000 mL via INTRAVENOUS

## 2021-02-23 MED ORDER — ALBUTEROL SULFATE HFA 108 (90 BASE) MCG/ACT IN AERS
1.0000 | INHALATION_SPRAY | Freq: Once | RESPIRATORY_TRACT | Status: AC
  Administered 2021-02-23: 2 via RESPIRATORY_TRACT
  Filled 2021-02-23: qty 6.7

## 2021-02-23 MED ORDER — METOCLOPRAMIDE HCL 5 MG/ML IJ SOLN
10.0000 mg | Freq: Once | INTRAMUSCULAR | Status: AC
  Administered 2021-02-23: 10 mg via INTRAVENOUS
  Filled 2021-02-23: qty 2

## 2021-02-23 MED ORDER — DICYCLOMINE HCL 20 MG PO TABS
20.0000 mg | ORAL_TABLET | Freq: Two times a day (BID) | ORAL | 0 refills | Status: DC
  Filled 2021-02-23: qty 10, 5d supply, fill #0

## 2021-02-23 MED ORDER — ONDANSETRON 4 MG PO TBDP
4.0000 mg | ORAL_TABLET | Freq: Three times a day (TID) | ORAL | 0 refills | Status: DC | PRN
  Filled 2021-02-23: qty 4, 2d supply, fill #0

## 2021-02-23 NOTE — ED Provider Notes (Signed)
[facility]-EMERGENCY DEPT Provider Note   CSN: [id number] Arrival date & time: 02/23/21  0907     History Chief Complaint  Patient presents with  . Shortness of Breath  . Cough  . Emesis  . Generalized Body Aches    Carrieanne L Lucena is a 45 y.o. female with a past medical history of hypertension, anxiety presenting to the ED with 3-day history of dry cough, generalized body aches, shortness of breath, vomiting, diarrhea.  States that she had a negative Covid test collected 2 days ago.  She has been trying DayQuil, NyQuil and NSAIDs with only minimal improvement in her pain.  She feels that the symptoms are making her anxiety worse.  States that she has had to miss several days of work because of it.  She denies any chest pain but does report generalized abdominal cramping.  States that she will have shortness of breath with a cough.  No known Covid exposures or sick contacts with similar symptoms.  Denies any bloody stools or vomiting.  She denies possibility of pregnancy.  Denies any chronic lung disease and was a former smoker.  HPI     Past Medical History:  Diagnosis Date  . Anxiety   . Exposure to sexually transmitted disease (STD) 09/25/2019  . Hypertension   . Thyroid disease     Patient Active Problem List   Diagnosis Date Noted  . Pre-diabetes 10/08/2020  . Chest pain at rest 09/07/2020  . HTN (hypertension), benign 03/12/2011  . Obesity 03/12/2011  . History of Graves' disease 02/12/2007    Past Surgical History:  Procedure Laterality Date  . FINGER FRACTURE SURGERY    . TUBAL LIGATION       OB History   No obstetric history on file.     Family History  Problem Relation Age of Onset  . Heart disease Father   . Diabetes Maternal Grandmother   . Diabetes Paternal Grandmother     Social History   Tobacco Use  . Smoking status: Former Games developer  . Smokeless tobacco: Never Used  Vaping Use  . Vaping Use: Never used  Substance Use  Topics  . Alcohol use: Yes    Alcohol/week: 1.0 - 2.0 standard drink    Types: 1 - 2 Glasses of wine per week  . Drug use: No    Home Medications Prior to Admission medications   Medication Sig Start Date End Date Taking? Authorizing Provider  dicyclomine (BENTYL) 20 MG tablet Take 1 tablet (20 mg total) by mouth 2 (two) times daily. 02/23/21  Yes Revonda Menter, PA-C  ondansetron (ZOFRAN ODT) 4 MG disintegrating tablet Take 1 tablet (4 mg total) by mouth every 8 (eight) hours as needed for nausea or vomiting. 02/23/21  Yes Climmie Buelow, PA-C  escitalopram (LEXAPRO) 20 MG tablet TAKE 1/2 TABLET BY MOUTH ONCE DAILY FOR 7 DAYS THEN 1 TABLET ONCE DAILY THEREAFTER. 09/07/20 09/07/21  Dollene Cleveland, DO    Allergies    Other  Review of Systems   Review of Systems  Constitutional: Positive for fatigue. Negative for appetite change, chills and fever.  HENT: Negative for ear pain, rhinorrhea, sneezing and sore throat.   Eyes: Negative for photophobia and visual disturbance.  Respiratory: Positive for cough and shortness of breath. Negative for chest tightness and wheezing.   Cardiovascular: Negative for chest pain and palpitations.  Gastrointestinal: Positive for diarrhea, nausea and vomiting. Negative for abdominal pain, blood in stool and constipation.  Genitourinary: Negative  for dysuria, hematuria and urgency.  Musculoskeletal: Positive for myalgias.  Skin: Negative for rash.  Neurological: Negative for dizziness, weakness and light-headedness.    Physical Exam Updated Vital Signs BP (!) 146/97   Pulse 90   Temp 98.2 F (36.8 C) (Oral)   Resp 19   Ht 5\' 5"  (1.651 m)   Wt 93 kg   LMP 02/12/2021   SpO2 100%   BMI 34.11 kg/m   Physical Exam Vitals and nursing note reviewed.  Constitutional:      General: She is not in acute distress.    Appearance: She is well-developed.     Comments: Cough noted.  Anxious, tearful.  HENT:     Head: Normocephalic and atraumatic.     Nose:  Nose normal.  Eyes:     General: No scleral icterus.       Left eye: No discharge.     Conjunctiva/sclera: Conjunctivae normal.  Cardiovascular:     Rate and Rhythm: Normal rate and regular rhythm.     Heart sounds: Normal heart sounds. No murmur heard. No friction rub. No gallop.   Pulmonary:     Effort: Pulmonary effort is normal. No respiratory distress.     Breath sounds: Examination of the right-lower field reveals wheezing. Wheezing present.  Abdominal:     General: Bowel sounds are normal. There is no distension.     Palpations: Abdomen is soft.     Tenderness: There is no abdominal tenderness. There is no guarding.     Comments: Abdomen is soft, nontender nondistended.  Musculoskeletal:        General: Normal range of motion.     Cervical back: Normal range of motion and neck supple.  Skin:    General: Skin is warm and dry.     Findings: No rash.  Neurological:     Mental Status: She is alert.     Motor: No abnormal muscle tone.     Coordination: Coordination normal.     ED Results / Procedures / Treatments   Labs (all labs ordered are listed, but only abnormal results are displayed) Labs Reviewed  COMPREHENSIVE METABOLIC PANEL - Abnormal; Notable for the following components:      Result Value   Glucose, Bld 101 (*)    Calcium 8.6 (*)    All other components within normal limits  CBC WITH DIFFERENTIAL/PLATELET - Abnormal; Notable for the following components:   Hemoglobin 11.5 (*)    All other components within normal limits  SARS CORONAVIRUS 2 (TAT 6-24 HRS)  LIPASE, BLOOD  I-STAT BETA HCG BLOOD, ED ([facility], [facility], [facility] ONLY)    EKG None  Radiology DG Chest Portable 1 View  Result Date: 02/23/2021 CLINICAL DATA:  Cough and shortness of breath. EXAM: PORTABLE CHEST 1 VIEW COMPARISON:  08/24/2016. FINDINGS: The heart size and mediastinal contours are within normal limits. Both lungs are clear. No pleural effusion or pneumothorax. The visualized skeletal structures  are unremarkable. IMPRESSION: No active disease. Electronically Signed   By: 10/24/2016 M.D.   On: 02/23/2021 10:05    Procedures Procedures   Medications Ordered in ED Medications  sodium chloride 0.9 % bolus 1,000 mL (1,000 mLs Intravenous New Bag/Given 02/23/21 1016)  metoCLOPramide (REGLAN) injection 10 mg (10 mg Intravenous Given 02/23/21 1017)  albuterol (VENTOLIN HFA) 108 (90 Base) MCG/ACT inhaler 1-2 puff (2 puffs Inhalation Given 02/23/21 1017)    ED Course  I have reviewed the triage vital signs and the nursing notes.  Pertinent labs & imaging results that were available during my care of the patient were reviewed by me and considered in my medical decision making (see chart for details).  Clinical Course as of 02/23/21 1210  Thu Feb 23, 2021  1009 DG Chest Portable 1 View No acute findings. [HK]  1009 I-stat hCG, quantitative: <5.0 [HK]  1009 Creatinine: 0.85 [HK]  1141 WBC: 4.5 [HK]  1141 Lipase: 37 [HK]    Clinical Course User Index [HK] Dietrich PatesKhatri, Bryonna Sundby, PA-C   MDM Rules/Calculators/A&P                          Emilyrose L Sloan was evaluated in Emergency Department on 02/23/21 for the symptoms described in the history of present illness. He/she was evaluated in the context of the global COVID-19 pandemic, which necessitated consideration that the patient might be at risk for infection with the SARS-CoV-2 virus that causes COVID-19. Institutional protocols and algorithms that pertain to the evaluation of patients at risk for COVID-19 are in a state of rapid change based on information released by regulatory bodies including the CDC and federal and state organizations. These policies and algorithms were followed during the patient's care in the ED.  45 year old female presenting to the ED with 3-day history of dry cough, shortness of breath, vomiting, diarrhea and myalgias.  Negative Covid test collected 2 days ago.  Minimal improvement noted with over-the-counter medications  such as DayQuil or NyQuil.  Denies any sick contacts.  Denies any chest pain or focal abdominal pain.  On exam patient does have a cough present.  Very minimal wheezing noted in right lower lung field.  Abdomen is soft, nontender nondistended.  Vital signs within normal limits, she is not hypoxic, tachycardic or febrile.  Will obtain lab work and chest x-ray and reassess after medications given.  Chest x-ray without any acute findings.  Lab work shows unremarkable CBC, CMP, lipase.  hCG is negative.  Patient did not IV fluids, Reglan and given as needed albuterol inhaler to help with symptoms.  On recheck patient is much more calm stating that her symptoms have significantly improved.  She remains hemodynamically stable.  She states that a lot of her feeling is related to stress and guilt regarding her home and work life and feeling ill and not being able to complete her responsibilities.  Explained to the patient that she will need to take care of herself and take the medications as needed as well as increase her hydration.  If her symptoms are not due to Covid I suspect this is most likely due to another viral infection.  Patient is agreeable to taking medications and following up with her PCP.  Return precautions given.   Patient is hemodynamically stable, in NAD, and able to ambulate in the ED. Evaluation does not show pathology that would require ongoing emergent intervention or inpatient treatment. I explained the diagnosis to the patient. Pain has been managed and has no complaints prior to discharge. Patient is comfortable with above plan and is stable for discharge at this time. All questions were answered prior to disposition. Strict return precautions for returning to the ED were discussed. Encouraged follow up with PCP.   An After Visit Summary was printed and given to the patient.   Portions of this note were generated with Scientist, clinical (histocompatibility and immunogenetics)Dragon dictation software. Dictation errors may occur despite best  attempts at proofreading.  Final Clinical Impression(s) / ED Diagnoses Final diagnoses:  Educated  about COVID-19 virus infection  Viral syndrome    Rx / DC Orders ED Discharge Orders         Ordered    ondansetron (ZOFRAN ODT) 4 MG disintegrating tablet  Every 8 hours PRN        02/23/21 1209    dicyclomine (BENTYL) 20 MG tablet  2 times daily        02/23/21 1209           Dietrich Pates, PA-C 02/23/21 1210    Cathren Laine, MD 02/23/21 1246

## 2021-02-23 NOTE — ED Triage Notes (Signed)
Patient c/o cough, body aches, chills, SOB, and vomiting x 3 days.

## 2021-02-23 NOTE — ED Notes (Signed)
Pt provided with juice to drink at her request.

## 2021-02-23 NOTE — ED Notes (Signed)
ED Provider at bedside. 

## 2021-02-23 NOTE — Discharge Instructions (Addendum)
Take the medications as needed to help with your symptoms. Make sure you are drinking plenty of fluids and slowly advancing your diet as tolerated to prevent vomiting. Continue Tylenol and ibuprofen to help with body aches and/or fever. If your COVID test is positive we will call you within the next 24 hours. You can also check on MyChart. Return to the ER if you start to experience continued vomiting, chest pain, shortness of breath, blurry vision.

## 2021-03-02 ENCOUNTER — Ambulatory Visit: Payer: 59

## 2021-05-21 ENCOUNTER — Encounter (HOSPITAL_COMMUNITY): Payer: Self-pay

## 2021-05-21 ENCOUNTER — Emergency Department (HOSPITAL_COMMUNITY)
Admission: EM | Admit: 2021-05-21 | Discharge: 2021-05-22 | Disposition: A | Discharge status: Home / Self Care | Payer: 59 | Attending: Emergency Medicine | Admitting: Emergency Medicine

## 2021-05-21 ENCOUNTER — Other Ambulatory Visit: Payer: Self-pay

## 2021-05-21 DIAGNOSIS — Z87891 Personal history of nicotine dependence: Secondary | ICD-10-CM | POA: Insufficient documentation

## 2021-05-21 DIAGNOSIS — Y92410 Unspecified street and highway as the place of occurrence of the external cause: Secondary | ICD-10-CM | POA: Diagnosis not present

## 2021-05-21 DIAGNOSIS — M545 Low back pain, unspecified: Secondary | ICD-10-CM | POA: Diagnosis not present

## 2021-05-21 DIAGNOSIS — M542 Cervicalgia: Secondary | ICD-10-CM | POA: Diagnosis not present

## 2021-05-21 DIAGNOSIS — I10 Essential (primary) hypertension: Secondary | ICD-10-CM | POA: Insufficient documentation

## 2021-05-21 DIAGNOSIS — M546 Pain in thoracic spine: Secondary | ICD-10-CM | POA: Insufficient documentation

## 2021-05-21 MED ORDER — LIDOCAINE 4 % EX PTCH: 1.0000 | MEDICATED_PATCH | Freq: Two times a day (BID) | CUTANEOUS | 0 refills | Status: DC | PRN

## 2021-05-21 MED ORDER — METHOCARBAMOL 500 MG PO TABS
500.0000 mg | ORAL_TABLET | Freq: Three times a day (TID) | ORAL | 0 refills | Status: DC | PRN
  Filled 2021-05-29: qty 20, 7d supply, fill #0

## 2021-05-21 NOTE — ED Notes (Signed)
Pt discharged by PA at triage. 
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
## 2021-05-29 ENCOUNTER — Other Ambulatory Visit (HOSPITAL_COMMUNITY): Payer: Self-pay

## 2021-06-06 ENCOUNTER — Ambulatory Visit (INDEPENDENT_AMBULATORY_CARE_PROVIDER_SITE_OTHER): Payer: 59 | Admitting: Family Medicine

## 2021-06-06 ENCOUNTER — Encounter: Payer: Self-pay | Admitting: Family Medicine

## 2021-06-06 ENCOUNTER — Other Ambulatory Visit: Payer: Self-pay

## 2021-06-06 VITALS — BP 140/90 | HR 68 | Ht 65.0 in | Wt 213.4 lb

## 2021-06-06 DIAGNOSIS — T148XXA Other injury of unspecified body region, initial encounter: Secondary | ICD-10-CM | POA: Diagnosis not present

## 2021-06-06 DIAGNOSIS — S29012D Strain of muscle and tendon of back wall of thorax, subsequent encounter: Secondary | ICD-10-CM
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
## 2021-06-06 NOTE — Patient Instructions (Signed)
It was great seeing you today!  Today we discussed your back pain. I am glad it is better. You may also try using a heating pad or alternating between ice and heat. Below is a list of therapists, please pick one and schedule an appointment at your earliest convenience.   Please follow up at your next scheduled appointment in 1 month, if anything arises between now and then, please don't hesitate to contact our office.   Thank you for allowing [place] to be a part of your medical care!  Thank you, Dr. Robyne Peers    Therapy and Counseling Resources Most providers on this list will take Medicaid. Patients with commercial insurance or Medicare should contact their insurance company to get a list of in network providers.  BestDay:Psychiatry and Counseling [address] [place], [place] [address] [phone]  [facility]  [address], Suite [place], [place] [address]      [phone]  [facility] [address]  [place], [place] [address] [phone]  [facility] [address]  [place], [place] [address]       [phone]     [facility] (virtual only) [phone]  Jovita Kussmaul [facility] [address], [place], [place] [phone]  [facility]:  [address] [place] [phone]  [facility]:  [address] [place]ien[place] [phone]  [facility] (under & uninsured) [address]   [place] [phone]    [email or web address]    [facility] [address]  [place]    [phone]  [facility] -[address]     Phone:  [phone]     [facility]-  [address]  [phone]   [facility] [address]      [place], [place] [phone]  [facility]: [address] [place], [place], [place]  [phone]    [facility] (Spanish therapist) [email or web address]  [address] [place]  [address]   [place] [address]    [phone]    [facility]   [address],  [place], [place]  [phone]   [facility]  [address] [place]  [phone]  [facility]  [address] [place], [place]        [phone]  Open Access/Walk In Clinic under & uninsured  [facility]  [address] [place], [place] [phone] Crisis [phone]  [facility] [place],  (Spanish)   [address], [place]: [phone]) 8:30 - 12; 1 - 2:30  [facility] [employer],  [address] [place], [place]    [phone]):8:30 - 12; 2 - 3PM  [facility] [employer],  [address],  [place]; [phone]):   Mon - Fri 8 AM - 5 PM  [facility] [address] [place]  MWF 12:30 to 3:00 or call to schedule an appointment  [phone]  Specific Provider options Psychology Today  [email or web address] click on find a therapist  enter your zip code left side and select or tailor a therapist for your specific need.   [facility] Provider Directory [email or web address]  (Medicaid)   Follow all drop down to find a provider  Social Support program [facility] [place] [phone] or  Photo[employer]pl [address], [place], [place] Recovery support and educational   24- Hour Availability:   [facility]  [address] [place], [place] [phone] Crisis [phone]  [facility] [phone]  [facility]  [phone]   [facility]  [phone] (after hours)  Therapeutic Alternative/Mobile Crisis   [phone]  [place] National Suicide Hotline  [phone] Len Childs)  Call 911 or go  to emergency room  [facility]  [phone]);  [place] and [employer]  [phone]); [place], [place], [place], [place], [place], [place], [place]
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
## 2021-07-04 ENCOUNTER — Telehealth: Payer: Self-pay | Admitting: *Deleted
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
## 2021-07-04 NOTE — Telephone Encounter (Signed)
Patient informed.  Alexandra Benjamin,CMA  

## 2021-07-11 ENCOUNTER — Telehealth: Payer: Self-pay | Admitting: Psychology

## 2021-07-11 ENCOUNTER — Ambulatory Visit (INDEPENDENT_AMBULATORY_CARE_PROVIDER_SITE_OTHER): Payer: 59 | Admitting: Family Medicine

## 2021-07-11 ENCOUNTER — Encounter: Payer: Self-pay | Admitting: Family Medicine

## 2021-07-11 ENCOUNTER — Other Ambulatory Visit: Payer: Self-pay

## 2021-07-11 VITALS — BP 130/70 | HR 64 | Ht 65.0 in | Wt 209.0 lb

## 2021-07-11 DIAGNOSIS — R4586 Emotional lability: Secondary | ICD-10-CM | POA: Diagnosis not present

## 2021-07-11 DIAGNOSIS — T148XXA Other injury of unspecified body region, initial encounter: Secondary | ICD-10-CM

## 2021-07-11 DIAGNOSIS — S29012D Strain of muscle and tendon of back wall of thorax, subsequent encounter: Secondary | ICD-10-CM | POA: Diagnosis not present

## 2021-07-11 NOTE — Patient Instructions (Signed)
It was great seeing you today!  I am sorry you are still in pain, I have placed a referral to physical therapy. Please let us know if you have not heard from them within 2 weeks so that we can assist with scheduling.  Below is a list of therapists, please pick one at your convenience and establish care.  Please follow up at your next scheduled appointment, if anything arises between now and then, please don't hesitate to contact our office.   Thank you for allowing [place] to be a part of your medical care!  Thank you, Dr. Robyne Peers    Therapy and Counseling Resources Most providers on this list will take Medicaid. Patients with commercial insurance or Medicare should contact their insurance company to get a list of in network providers.  BestDay:Psychiatry and Counseling [address] [facility] [facility] [address] [place], [place] [address] [phone]  [facility]  [address], Suite [place], [place] [address]      [phone]  [facility] [address]  [place], [place] [address] [phone]  [facility] [address]  [place], [place] [address]       [phone]     [facility] (virtual only) [phone]  [facility]ov[facility] [address] [place], [place] [phone]  [facility]:  [address] [place] [phone]  [facility]:  [address] [place] [phone]  [facility] (under & uninsured) [address]   [place] [phone]    [email or web address]    [facility] [address]  [place]    [phone]  [facility] -[address]     Phone:  [phone]     [facility]-  [address] [place]  [phone]   [facility] [address]      [place], [place] [phone]  [facility]: [address] [place],  [place], [place]  [phone]   [facility] (Spanish therapist) [email or web address]  [address] [place]  [address]   [place] [address]    [phone]    [facility]   [address],  [place], [place]  [phone]   [facility]  [address] [place]  [phone]  [facility]  [address] [place], [place]        [phone]  Open Access/Walk In Clinic under & uninsured  [facility]  [address] [place], [place] [phone] Crisis [phone]  [facility] [place],  (Spanish)   [address] [place], [place]: [phone]) 8:30 - 12; 1 - 2:30  [facility] [employer],  [address] [place], [place]    [phone]):8:30 - 12; 2 - 3PM  [facility] [employer],  [address]  [place]; [phone]):   Mon - Fri 8 AM - 5 PM  [facility] [address] [place]  MWF 12:30 to 3:00 or call to schedule an appointment  [phone]  Specific Provider options Psychology Today  [email or web address]/us click on find a therapist  enter your zip code left side and select or tailor a therapist for your specific need.   [facility] Provider Directory [email or web address]  (Medicaid)   Follow all drop down to find a provider  Social Support program  [facility] [place] or [employer] [address], [place], [place] Recovery support and educational   24- Hour Availability:   [facility]  [address] [place], [place] [phone] Crisis [phone]  [facility] [phone]  [place] [facility]  [phone]   [facility]  [phone] (after hours)  Therapeutic Alternative/Mobile Crisis   [phone]  [place] National Suicide Hotline   [phone] Len Childs)  Call 911 or go to emergency room  [facility]  [phone]);  [place] and [employer]  [phone]); [place], [place], [place], [place], [place], [place], [place]
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
## 2021-07-11 NOTE — Assessment & Plan Note (Addendum)
-  very likely continued muscle strain, very low concern for fracture but will consider imaging at next follow up if pain persists or worsens. Not of any infectious etiology. Reassuringly no red flag symptoms. -continue current medication therapy and ice/heat  -PT referral placed -handout on stretches provided  -strict return precautions given  -follow up within a month if pain continues, consider sports medicine referral and imaging at this time

## 2021-07-12 NOTE — Telephone Encounter (Signed)
Made 2 phone call attempts to call pt to set up appt. Unable to leave VM

## 2021-07-17 ENCOUNTER — Other Ambulatory Visit: Payer: Self-pay

## 2021-07-17 ENCOUNTER — Ambulatory Visit (INDEPENDENT_AMBULATORY_CARE_PROVIDER_SITE_OTHER): Payer: 59 | Admitting: Psychology

## 2021-07-17 DIAGNOSIS — F411 Generalized anxiety disorder: Secondary | ICD-10-CM | POA: Diagnosis not present
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
## 2021-07-27 ENCOUNTER — Encounter: Payer: Self-pay | Admitting: Family Medicine

## 2021-07-27 ENCOUNTER — Other Ambulatory Visit: Payer: Self-pay | Admitting: Emergency Medicine

## 2021-07-27 ENCOUNTER — Other Ambulatory Visit: Payer: Self-pay

## 2021-07-28 ENCOUNTER — Other Ambulatory Visit (HOSPITAL_COMMUNITY): Payer: Self-pay

## 2021-07-28 ENCOUNTER — Other Ambulatory Visit: Payer: Self-pay | Admitting: Family Medicine

## 2021-07-28 ENCOUNTER — Other Ambulatory Visit: Payer: Self-pay | Admitting: Emergency Medicine

## 2021-07-28 DIAGNOSIS — F411 Generalized anxiety disorder: Secondary | ICD-10-CM

## 2021-07-28 MED ORDER — ESCITALOPRAM OXALATE 20 MG PO TABS
20.0000 mg | ORAL_TABLET | Freq: Every day | ORAL | 1 refills | Status: DC
  Filled 2021-07-28: qty 30, 30d supply, fill #0

## 2021-07-31 ENCOUNTER — Ambulatory Visit: Payer: 59 | Admitting: Psychology

## 2021-08-01 ENCOUNTER — Other Ambulatory Visit: Payer: Self-pay | Admitting: Emergency Medicine

## 2021-08-01 ENCOUNTER — Other Ambulatory Visit (HOSPITAL_COMMUNITY): Payer: Self-pay

## 2021-08-02 ENCOUNTER — Other Ambulatory Visit: Payer: Self-pay | Admitting: Emergency Medicine

## 2021-08-02 ENCOUNTER — Other Ambulatory Visit (HOSPITAL_COMMUNITY): Payer: Self-pay

## 2021-08-07 ENCOUNTER — Ambulatory Visit: Payer: 59 | Admitting: Psychology

## 2021-08-08 ENCOUNTER — Ambulatory Visit (INDEPENDENT_AMBULATORY_CARE_PROVIDER_SITE_OTHER): Payer: 59 | Admitting: Psychology

## 2021-08-08 ENCOUNTER — Other Ambulatory Visit: Payer: Self-pay

## 2021-08-08 DIAGNOSIS — F411 Generalized anxiety disorder: Secondary | ICD-10-CM | POA: Diagnosis not present
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
## 2021-08-09 ENCOUNTER — Ambulatory Visit: Payer: 59 | Attending: Family Medicine

## 2021-08-09 VITALS — BP 199/123

## 2021-08-09 DIAGNOSIS — M545 Low back pain, unspecified: Secondary | ICD-10-CM | POA: Insufficient documentation

## 2021-08-09 DIAGNOSIS — M546 Pain in thoracic spine: Secondary | ICD-10-CM | POA: Insufficient documentation

## 2021-08-10 ENCOUNTER — Encounter: Payer: Self-pay | Admitting: Family Medicine
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
## 2021-08-21 ENCOUNTER — Encounter: Payer: Self-pay | Admitting: Family Medicine

## 2021-08-22 ENCOUNTER — Ambulatory Visit: Payer: 59 | Admitting: Psychology

## 2021-10-01 IMAGING — DX DG CHEST 1V PORT
1 series · 1 of 1 positions shown · non-contrast
Comparison: 08/24/2016.

CLINICAL DATA: Cough and shortness of breath.

EXAM:
PORTABLE CHEST 1 VIEW

[chest ap]
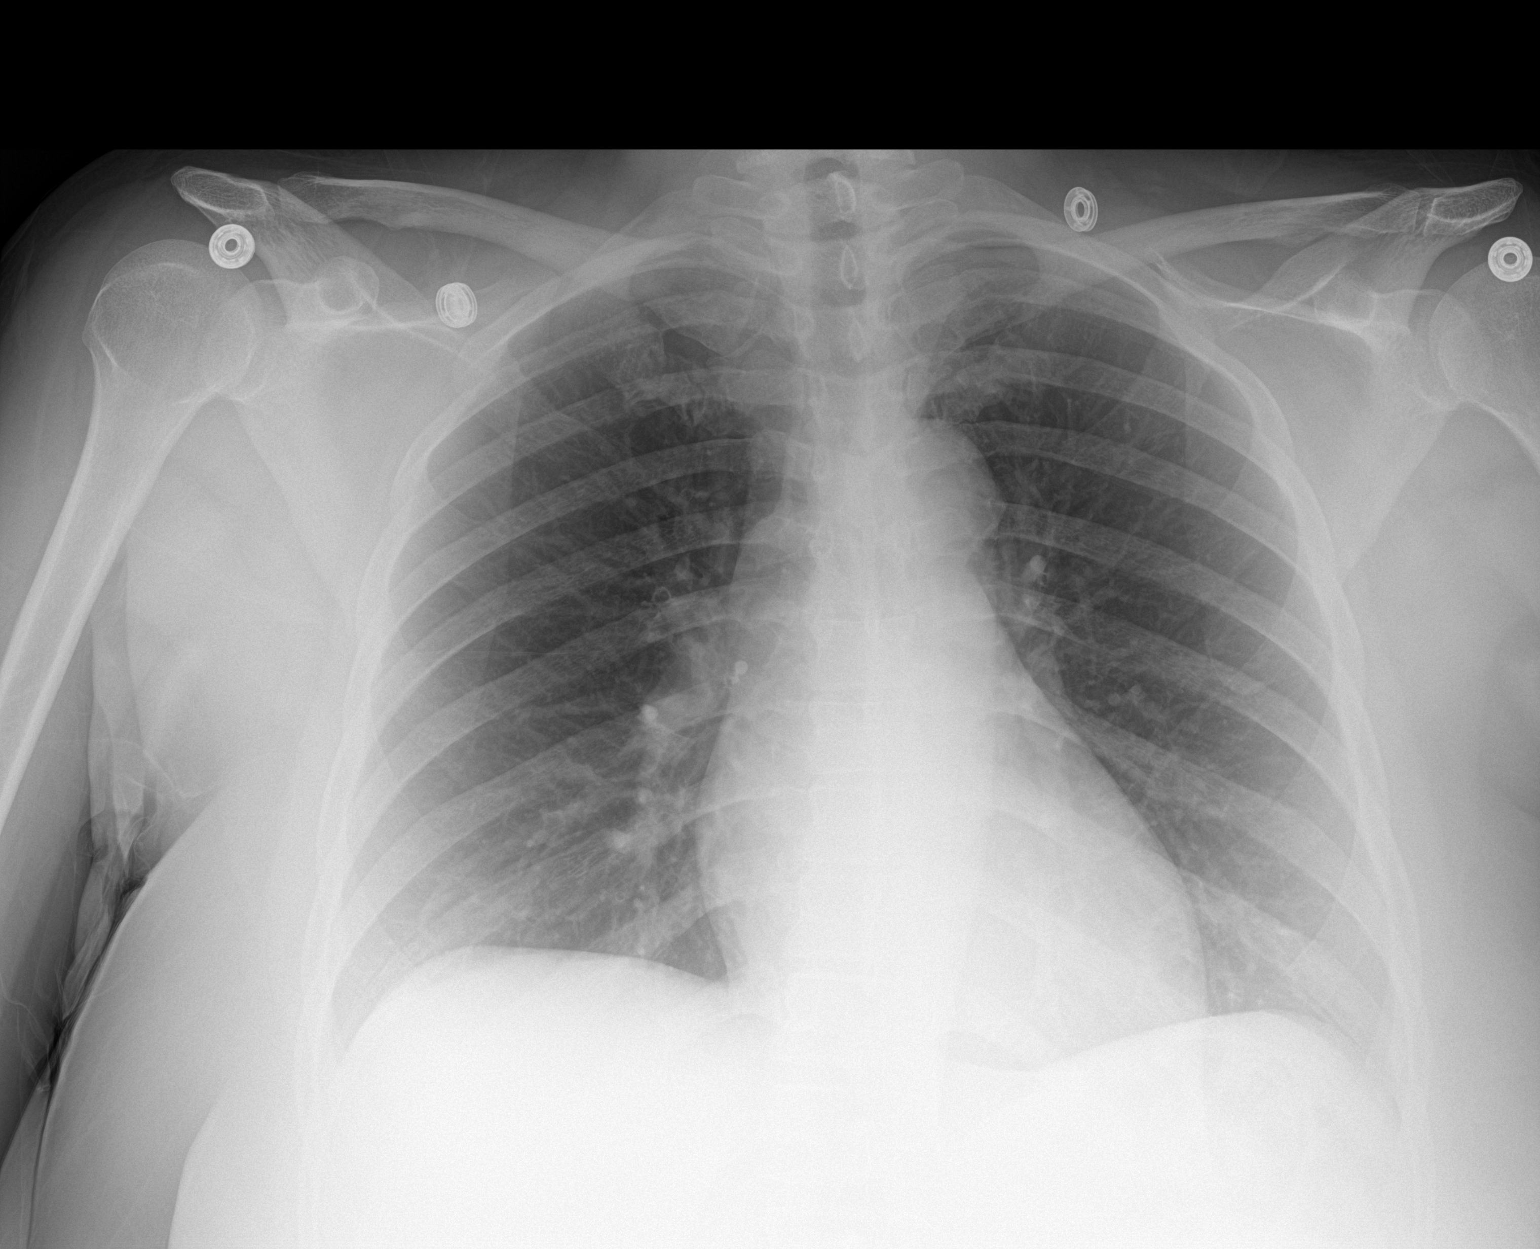

[1 of 1 positions shown; findings below may reference images not displayed]

FINDINGS: The heart size and mediastinal contours are within normal limits.
Both lungs are clear. No pleural effusion or pneumothorax. The
visualized skeletal structures are unremarkable.
IMPRESSION: No active disease.

## 2021-10-19 ENCOUNTER — Other Ambulatory Visit: Payer: Self-pay

## 2021-10-19 ENCOUNTER — Other Ambulatory Visit (HOSPITAL_COMMUNITY): Payer: Self-pay

## 2021-10-19 ENCOUNTER — Ambulatory Visit (INDEPENDENT_AMBULATORY_CARE_PROVIDER_SITE_OTHER): Payer: 59 | Admitting: Family Medicine

## 2021-10-19 VITALS — BP 155/112 | HR 93 | Ht 65.0 in | Wt 210.2 lb

## 2021-10-19 DIAGNOSIS — M5441 Lumbago with sciatica, right side: Secondary | ICD-10-CM | POA: Diagnosis not present

## 2021-10-19 DIAGNOSIS — I10 Essential (primary) hypertension: Secondary | ICD-10-CM | POA: Diagnosis not present

## 2021-10-19 DIAGNOSIS — Z6834 Body mass index (BMI) 34.0-34.9, adult: Secondary | ICD-10-CM | POA: Diagnosis not present

## 2021-10-19 DIAGNOSIS — T148XXA Other injury of unspecified body region, initial encounter: Secondary | ICD-10-CM

## 2021-10-19 DIAGNOSIS — Z8639 Personal history of other endocrine, nutritional and metabolic disease: Secondary | ICD-10-CM

## 2021-10-19 DIAGNOSIS — Z1159 Encounter for screening for other viral diseases: Secondary | ICD-10-CM | POA: Diagnosis not present

## 2021-10-19 DIAGNOSIS — E6609 Other obesity due to excess calories: Secondary | ICD-10-CM | POA: Diagnosis not present

## 2021-10-19 DIAGNOSIS — R7303 Prediabetes: Secondary | ICD-10-CM

## 2021-10-19 MED ORDER — AMLODIPINE BESYLATE 5 MG PO TABS
5.0000 mg | ORAL_TABLET | Freq: Every day | ORAL | 3 refills | Status: DC
  Filled 2021-10-19: qty 90, 90d supply, fill #0

## 2021-10-19 MED ORDER — METHOCARBAMOL 500 MG PO TABS
500.0000 mg | ORAL_TABLET | Freq: Three times a day (TID) | ORAL | 1 refills | Status: DC | PRN
Start: 2021-10-19 — End: 2024-03-26
  Filled 2021-10-19: qty 30, 10d supply, fill #0

## 2021-10-19 NOTE — Patient Instructions (Addendum)
It was great seeing you today!  Please check-out at the front desk before leaving the clinic. I'd like to see you back in 2-4 weeks but if you need to be seen earlier than that for any new issues we're happy to fit you in, just give [place] a call!  Visit Remembers: - Stop by the pharmacy to pick up your prescription - Continue to work on your healthy eating habits and incorporating exercise into your daily life.  - Make small changes for a lasting effect.  - Your goal is to have an BP < 140/90  Call us if: you have significantly elevated blood pressure and have any head aches, vision changes, dizziness, etc, you should be seen at the emergency department    Regarding lab work today:  Due to recent changes in healthcare laws, you may see the results of your imaging and laboratory studies on MyChart before your provider has had a chance to review them.  I understand that in some cases there may be results that are confusing or concerning to you. Not all laboratory results come back in the same time frame and you may be waiting for multiple results in order to interpret others.  Please give [place] 72 hours in order for your provider to thoroughly review all the results before contacting the office for clarification of your results. If everything is normal, you will get a letter in the mail or a message in My Chart. Please give [place] a call if you do not hear from [place] after 2 weeks.  Please bring all of your medications with you to each visit.    If you haven't already, sign up for My Chart to have easy access to your labs results, and communication with your primary care physician.  Feel free to call with any questions or concerns at any time, at [phone].   Take care,  Dr. Katherina [facility]
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
## 2021-10-20 LAB — LIPID PANEL
Chol/HDL Ratio: 3.1 ratio (ref 0.0–4.4)
Cholesterol, Total: 261 mg/dL — ABNORMAL HIGH (ref 100–199)
HDL: 84 mg/dL (ref >39)
LDL Chol Calc (NIH): 161 mg/dL — ABNORMAL HIGH (ref 0–99)
Triglycerides: 95 mg/dL (ref 0–149)
VLDL Cholesterol Cal: 16 mg/dL (ref 5–40)

## 2021-10-20 LAB — BASIC METABOLIC PANEL
BUN/Creatinine Ratio: 14 (ref 9–23)
BUN: 11 mg/dL (ref 6–24)
CO2: 23 mmol/L (ref 20–29)
Calcium: 9.5 mg/dL (ref 8.7–10.2)
Chloride: 104 mmol/L (ref 96–106)
Creatinine, Ser: 0.8 mg/dL (ref 0.57–1.00)
Glucose: 104 mg/dL — ABNORMAL HIGH (ref 70–99)
Potassium: 4.4 mmol/L (ref 3.5–5.2)
Sodium: 141 mmol/L (ref 134–144)
eGFR: 93 mL/min/{1.73_m2} (ref >59)

## 2021-10-20 LAB — HCV AB W REFLEX TO QUANT PCR: HCV Ab: <0.1 s/co ratio (ref 0.0–0.9)

## 2021-10-20 LAB — HCV INTERPRETATION

## 2021-10-20 LAB — TSH+FREE T4
Free T4: 1.12 ng/dL (ref 0.82–1.77)
TSH: 2.14 u[IU]/mL (ref 0.450–4.500)

## 2021-10-22 ENCOUNTER — Encounter: Payer: Self-pay | Admitting: Family Medicine

## 2021-10-22 NOTE — Assessment & Plan Note (Signed)
Body mass index is 34.98 kg/m.  Discussed healthy lifestyle. - obtain a1c, lipid panel

## 2021-10-22 NOTE — Assessment & Plan Note (Signed)
Does not follow up with endocrinology. TSH last year was within normal limits.  Recheck TSH and T4 today.
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
## 2021-10-22 NOTE — Assessment & Plan Note (Addendum)
BP not at goal.  Start amlodipine 5 mg. Follow up in 2-4 weeks.  - BMP today

## 2021-10-22 NOTE — Assessment & Plan Note (Signed)
Previous a1c 5.8 and obtain repeat a1c.

## 2021-11-22 ENCOUNTER — Other Ambulatory Visit (HOSPITAL_COMMUNITY): Payer: Self-pay

## 2021-11-22 ENCOUNTER — Telehealth: Payer: 59 | Admitting: Physician Assistant

## 2021-11-22 DIAGNOSIS — R3989 Other symptoms and signs involving the genitourinary system: Secondary | ICD-10-CM

## 2021-11-22 MED ORDER — CEPHALEXIN 500 MG PO CAPS
500.0000 mg | ORAL_CAPSULE | Freq: Two times a day (BID) | ORAL | 0 refills | Status: DC
  Filled 2021-11-22: qty 14, 7d supply, fill #0

## 2021-11-22 NOTE — Progress Notes (Signed)
Virtual Visit Consent   Alexandra Benjamin, you are scheduled for a virtual visit with a [facility] provider today.     Just as with appointments in the office, your consent must be obtained to participate.  Your consent will be active for this visit and any virtual visit you may have with one of our providers in the next 365 days.     If you have a MyChart account, a copy of this consent can be sent to you electronically.  All virtual visits are billed to your insurance company just like a traditional visit in the office.    As this is a virtual visit, video technology does not allow for your provider to perform a traditional examination.  This may limit your provider's ability to fully assess your condition.  If your provider identifies any concerns that need to be evaluated in person or the need to arrange testing (such as labs, EKG, etc.), we will make arrangements to do so.     Although advances in technology are sophisticated, we cannot ensure that it will always work on either your end or our end.  If the connection with a video visit is poor, the visit may have to be switched to a telephone visit.  With either a video or telephone visit, we are not always able to ensure that we have a secure connection.     I need to obtain your verbal consent now.   Are you willing to proceed with your visit today?    Kimbrely L Sine has provided verbal consent on 11/22/2021 for a virtual visit (video or telephone).   Alexandra Benjamin, [place]   Date: 11/22/2021 2:03 PM   Virtual Visit via Video Note   I, Alexandra Benjamin, connected with  KARCYN MENN  ([id number], 05/22/1976) on 11/22/21 at  2:00 PM EST by a video-enabled telemedicine application and verified that I am speaking with the correct person using two identifiers.  Location: Patient: Virtual Visit Location Patient: Home Provider: Virtual Visit Location Provider: Home Office   I discussed the limitations of evaluation and management by  telemedicine and the availability of in person appointments. The patient expressed understanding and agreed to proceed.    History of Present Illness: [name] is a 46 y.o. who identifies as a female who was assigned female at birth, and is being seen today for 4-5 days of urinary urgency, frequency, hesitancy. Also noting some cloudy urine. Denies fever, chills, vomiting, flank pain. Some mild low back pain. Denies vaginal symptoms or concern for STI/pregnancy.    HPI: HPI  Problems:  Patient Active Problem List   Diagnosis Date Noted   Mood changes 07/11/2021   Muscle strain 06/06/2021   Pre-diabetes 10/08/2020   Chest pain at rest 09/07/2020   HTN (hypertension), benign 03/12/2011   Obesity 03/12/2011   History of Graves' disease 02/12/2007    Allergies:  Allergies  Allergen Reactions   Other     tomatoes   Medications:  Current Outpatient Medications:    amLODipine (NORVASC) 5 MG tablet, Take 1 tablet (5 mg total) by mouth at bedtime., Disp: 90 tablet, Rfl: 3   escitalopram (LEXAPRO) 20 MG tablet, Take 1/2 tablet by mouth for 1 week then 1 tablet (20 mg total) by mouth daily thereafter, Disp: 30 tablet, Rfl: 1   Lidocaine (HM LIDOCAINE PATCH) 4 % PTCH, Apply 1 patch topically every 12 (twelve) hours as needed., Disp: 30 patch, Rfl: 0  methocarbamol (ROBAXIN) 500 MG tablet, Take 1 tablet (500 mg total) by mouth every 8 (eight) hours as needed for muscle spasms., Disp: 30 tablet, Rfl: 1   ondansetron (ZOFRAN ODT) 4 MG disintegrating tablet, Take 1 tablet (4 mg total) by mouth every 8 (eight) hours as needed for nausea or vomiting. (Patient not taking: No sig reported), Disp: 4 tablet, Rfl: 0  Observations/Objective: Patient is well-developed, well-nourished in no acute distress.  Resting comfortably at home.  Head is normocephalic, atraumatic.  No labored breathing. Speech is clear and coherent with logical content.  Patient is alert and oriented at baseline.    Assessment and Plan: 1. Suspected UTI  Classic UTI symptoms in the absence of alarm signs/symptoms. Will start Keflex 500 mg BID x 7 days. Strict in-person follow-up discussed.   Follow Up Instructions: I discussed the assessment and treatment plan with the patient. The patient was provided an opportunity to ask questions and all were answered. The patient agreed with the plan and demonstrated an understanding of the instructions.  A copy of instructions were sent to the patient via MyChart unless otherwise noted below.   The patient was advised to call back or seek an in-person evaluation if the symptoms worsen or if the condition fails to improve as anticipated.  Time:  I spent 12 minutes with the patient via telehealth technology discussing the above problems/concerns.    Alexandra Climes, PA-C

## 2021-11-22 NOTE — Patient Instructions (Addendum)
Nea L Lehnert, thank you for joining [name], PA-C for today's virtual visit.  While this provider is not your primary care provider (PCP), if your PCP is located in our provider database this encounter information will be shared with them immediately following your visit.  Consent: (Patient) Alexandra Benjamin provided verbal consent for this virtual visit at the beginning of the encounter.  Current Medications:  Current Outpatient Medications:    amLODipine (NORVASC) 5 MG tablet, Take 1 tablet (5 mg total) by mouth at bedtime., Disp: 90 tablet, Rfl: 3   dicyclomine (BENTYL) 20 MG tablet, Take 1 tablet (20 mg total) by mouth 2 (two) times daily. (Patient not taking: No sig reported), Disp: 10 tablet, Rfl: 0   escitalopram (LEXAPRO) 20 MG tablet, Take 1/2 tablet by mouth for 1 week then 1 tablet (20 mg total) by mouth daily thereafter, Disp: 30 tablet, Rfl: 1   Lidocaine (HM LIDOCAINE PATCH) 4 % PTCH, Apply 1 patch topically every 12 (twelve) hours as needed., Disp: 30 patch, Rfl: 0   methocarbamol (ROBAXIN) 500 MG tablet, Take 1 tablet (500 mg total) by mouth every 8 (eight) hours as needed for muscle spasms., Disp: 30 tablet, Rfl: 1   ondansetron (ZOFRAN ODT) 4 MG disintegrating tablet, Take 1 tablet (4 mg total) by mouth every 8 (eight) hours as needed for nausea or vomiting. (Patient not taking: No sig reported), Disp: 4 tablet, Rfl: 0   Medications ordered in this encounter:  No orders of the defined types were placed in this encounter.    *If you need refills on other medications prior to your next appointment, please contact your pharmacy*  Follow-Up: Call back or seek an in-person evaluation if the symptoms worsen or if the condition fails to improve as anticipated.  Other Instructions Your symptoms are consistent with a bladder infection, also called acute cystitis. Please take your antibiotic (Keflex) as directed until all pills are gone.  Stay very well hydrated.   Consider a daily probiotic (Align, Culturelle, or Activia) to help prevent stomach upset caused by the antibiotic.  Taking a probiotic daily may also help prevent recurrent UTIs.  Also consider taking AZO (Phenazopyridine) tablets to help decrease pain with urination.     Urinary Tract Infection A urinary tract infection (UTI) can occur any place along the urinary tract. The tract includes the kidneys, ureters, bladder, and urethra. A type of germ called bacteria often causes a UTI. UTIs are often helped with antibiotic medicine.  HOME CARE  If given, take antibiotics as told by your doctor. Finish them even if you start to feel better. Drink enough fluids to keep your pee (urine) clear or pale yellow. Avoid tea, drinks with caffeine, and bubbly (carbonated) drinks. Pee often. Avoid holding your pee in for a long time. Pee before and after having sex (intercourse). Wipe from front to back after you poop (bowel movement) if you are a woman. Use each tissue only once. GET HELP RIGHT AWAY IF:  You have back pain. You have lower belly (abdominal) pain. You have chills. You feel sick to your stomach (nauseous). You throw up (vomit). Your burning or discomfort with peeing does not go away. You have a fever. Your symptoms are not better in 3 days. MAKE SURE YOU:  Understand these instructions. Will watch your condition. Will get help right away if you are not doing well or get worse. Document Released: 04/23/2008 Document Revised: 07/30/2012 Document Reviewed: 06/05/2012 ExitCare Patient Information 2015 [employer]. This information is not intended to replace advice given to you by your health care provider. Make sure you discuss any questions you have with your health care provider.    If you have been instructed to have an in-person evaluation today at a local Urgent Care facility, please use the link below. It will take you to a list of all of our available [facility],  including address, phone number and hours of operation. Please do not delay care.  [facility]  If you or a family member do not have a primary care provider, use the link below to schedule a visit and establish care. When you choose a [facility] primary care physician or advanced practice provider, you gain a long-term partner in health. Find a Primary Care Provider  Learn more about [facility]'s in-office and virtual care options: [facility] - Get Care Now

## 2021-12-11 ENCOUNTER — Other Ambulatory Visit (HOSPITAL_COMMUNITY): Payer: Self-pay

## 2021-12-11 ENCOUNTER — Ambulatory Visit: Payer: 59 | Admitting: Family Medicine

## 2021-12-11 ENCOUNTER — Encounter: Payer: Self-pay | Admitting: Family Medicine

## 2021-12-11 ENCOUNTER — Other Ambulatory Visit: Payer: Self-pay

## 2021-12-11 DIAGNOSIS — I10 Essential (primary) hypertension: Secondary | ICD-10-CM | POA: Diagnosis not present

## 2021-12-11 MED ORDER — AMLODIPINE BESYLATE 10 MG PO TABS
10.0000 mg | ORAL_TABLET | Freq: Every day | ORAL | 0 refills | Status: DC
  Filled 2021-12-11: qty 90, 90d supply, fill #0

## 2021-12-11 NOTE — Patient Instructions (Signed)
Take two tablets to finish your blood pressure medication supply at home. Pick up your new 10 mg tablets at home.

## 2021-12-11 NOTE — Progress Notes (Signed)
° °  SUBJECTIVE:   CHIEF COMPLAINT / HPI:   Chief Complaint  Patient presents with   Hypertension     Alexandra Benjamin is a 46 y.o. female here for blood pressure follow up. She has started using stress management techniques.  She does not check her BP.   Denies missing doses of antihypertensive medications. Denies chest pain, palpitations, lower extremity edema, exertional dyspnea, lightheadedness and vision changes. Occasionally gets headaches. She would like to resume physical therapy for her back pain but needs her BP to normalize.     PERTINENT  PMH / PSH: reviewed and updated as appropriate   OBJECTIVE:   BP (!) 136/95    Pulse 83    Ht 5\' 5"  (1.651 m)    Wt 209 lb 6.4 oz (95 kg)    LMP 12/09/2021    SpO2 100%    BMI 34.85 kg/m    GEN: well appearing female, in no acute distress  CV: regular rate and rhythm, no murmurs appreciated  RESP: no increased work of breathing, clear to ascultation bilaterally SKIN: warm, dry  ASSESSMENT/PLAN:   HTN (hypertension), benign BP improved but not at goal.  Increase amlodipine to 10 mg qhs.  Consider combination antihypertensive if not controlled at follow up. Follow up in 1 month. Patient able to resume PT, advised to check BP prior to session.      12/11/2021, DO PGY-3, [facility] 12/11/2021

## 2021-12-11 NOTE — Assessment & Plan Note (Addendum)
BP improved but not at goal.  Increase amlodipine to 10 mg qhs.  Consider combination antihypertensive if not controlled at follow up. Follow up in 1 month. Patient able to resume PT, advised to check BP prior to session.

## 2021-12-18 ENCOUNTER — Encounter: Payer: Self-pay | Admitting: Family Medicine

## 2021-12-19 ENCOUNTER — Other Ambulatory Visit: Payer: Self-pay | Admitting: Family Medicine

## 2021-12-19 ENCOUNTER — Other Ambulatory Visit (HOSPITAL_COMMUNITY): Payer: Self-pay

## 2021-12-19 DIAGNOSIS — M5441 Lumbago with sciatica, right side: Secondary | ICD-10-CM

## 2021-12-19 DIAGNOSIS — T148XXA Other injury of unspecified body region, initial encounter: Secondary | ICD-10-CM

## 2022-01-10 ENCOUNTER — Ambulatory Visit: Payer: 59 | Admitting: Family Medicine

## 2022-01-13 ENCOUNTER — Ambulatory Visit: Payer: 59 | Admitting: Physical Therapy

## 2022-01-17 ENCOUNTER — Ambulatory Visit: Payer: 59 | Attending: Family Medicine

## 2022-01-17 ENCOUNTER — Other Ambulatory Visit: Payer: Self-pay

## 2022-01-17 DIAGNOSIS — M545 Low back pain, unspecified: Secondary | ICD-10-CM | POA: Insufficient documentation

## 2022-01-17 DIAGNOSIS — R2689 Other abnormalities of gait and mobility: Secondary | ICD-10-CM | POA: Diagnosis not present

## 2022-01-17 DIAGNOSIS — M5441 Lumbago with sciatica, right side: Secondary | ICD-10-CM | POA: Diagnosis not present

## 2022-01-17 DIAGNOSIS — G8929 Other chronic pain: Secondary | ICD-10-CM | POA: Insufficient documentation

## 2022-01-17 DIAGNOSIS — M6281 Muscle weakness (generalized): Secondary | ICD-10-CM | POA: Insufficient documentation

## 2022-01-17 NOTE — Therapy (Signed)
?OUTPATIENT PHYSICAL THERAPY THORACOLUMBAR EVALUATION ? ? ?Patient Name: Alexandra Benjamin ?MRN: [id number] ?DOB:10-11-1976, 46 y.o., female ?Today's Date: 01/18/2022 ? ? PT End of Session - 01/17/22 1741   ? ? Visit Number 1   ? Number of Visits 17   ? Date for PT Re-Evaluation 03/14/22   ? Authorization Type [facility]   ? PT Start Time 1745   ? PT Stop Time 1828   ? PT Time Calculation (min) 43 min   ? Activity Tolerance Patient tolerated treatment well   ? Behavior During Therapy [facility] for tasks assessed/performed   ? ?  ?  ? ?  ? ? ?Past Medical History:  ?Diagnosis Date  ? Anxiety   ? Exposure to sexually transmitted disease (STD) 09/25/2019  ? Hypertension   ? Thyroid disease   ? ?Past Surgical History:  ?Procedure Laterality Date  ? FINGER FRACTURE SURGERY    ? TUBAL LIGATION    ? ?Patient Active Problem List  ? Diagnosis Date Noted  ? Mood changes 07/11/2021  ? Muscle strain 06/06/2021  ? Pre-diabetes 10/08/2020  ? Chest pain at rest 09/07/2020  ? HTN (hypertension), benign 03/12/2011  ? Obesity 03/12/2011  ? History of Graves' disease 02/12/2007  ? ? ?PCP: Reece Leader, DO ? ?REFERRING PROVIDER: Moses Manners, MD ? ?REFERRING DIAG:  ?M54.41 (ICD-10-CM) - Right-sided low back pain with right-sided sciatica, unspecified chronicity ? ?THERAPY DIAG:  ?Chronic low back pain, unspecified back pain laterality, unspecified whether sciatica present ? ?Muscle weakness (generalized) ? ?Other abnormalities of gait and mobility ? ?ONSET DATE: Chronic ? ?SUBJECTIVE:                                                                                                                                                                                          ? ?SUBJECTIVE STATEMENT: ?Pt presents to PT with reports of chronic lower back pain with referral into R LE. Denies N/T, does have pain referral in posterior R thigh. Also denies bowel/bladder changes or saddle anesthesia. Notes that she has had a lot of stressful personal events  that seem to have increased pain as well. Has started BP medication and was cleared for PT by PCP recently. Has pain with prolonged standing, would like to improve comfort with work and home ADLs.  ? ?PERTINENT HISTORY:  ?HTN, Graves disease  ? ?PAIN:  ?Are you having pain? Yes ?NPRS scale: 5/10 (10/10 at worst) ?Pain location: lower back, R LE ?PAIN TYPE: aching and sharp ?Pain description: intermittent  ?Aggravating factors: prolonged standing ?Relieving factors: heat,  ? ?PRECAUTIONS: None ? ?WEIGHT BEARING RESTRICTIONS No ? ?FALLS:  ?  Has patient fallen in last 6 months? No, Number of falls: N/A ? ?LIVING ENVIRONMENT: ?Lives with: lives with their family ?Lives in: House/apartment ?Stairs: Yes; no barriers ?Has following equipment at home: None ? ?OCCUPATION: OR equipment ? ?PLOF: Independent and Independent with basic ADLs ? ?PATIENT GOALS: wants to decrease pain to improve standing tolerance and get back  ? ? ?OBJECTIVE:  ? ?VITALS: ? BP: 140/88 ? ?DIAGNOSTIC FINDINGS:  ?N/A ? ?PATIENT SURVEYS:  ?FOTO 47% function; 63% predicted ? ?COGNITION: ? Overall cognitive status: Within functional limits for tasks assessed   ?  ?SENSATION: ? Light touch: Appears intact ?  ?MUSCLE LENGTH: ?Hamstrings: Right WFL deg; Left WFL deg ? ?POSTURE:  ?Medium body habitus; increased lumbar lordosis ? ?PALPATION: ?TTP to R sided lumbar paraspinals ? ?REPEATED MOVEMENTS: ? No change with repeated flexion; increased pain with extension (motion decreased) ? ?LE MMT: ? ?MMT Right ?01/18/2022 Left ?01/18/2022  ?Hip flexion  4/5 4/5  ?Hip extension    ?Hip abduction 3+/5 3+/5  ?Hip adduction    ?Hip external rotation    ?Hip internal rotation    ?Knee extension 5/5 5/5  ?Knee flexion 5/5 5/5  ?Ankle dorsiflexion     ?Ankle plantarflexion    ?Ankle inversion    ?Ankle eversion    ?Grossly    ?(Blank rows = not tested) ? ? ?LUMBAR SPECIAL TESTS:  ?Straight leg raise test: Positive and Slump test: Positive ? ?FUNCTIONAL TESTS:  ?30 seconds chair  stand test:  ? ?GAIT: ?Distance walked: 67ft ?Assistive device utilized: None ?Level of assistance: Complete Independence ?Comments: antalgic gait towards R ? ?TODAY'S TREATMENT  ?[facility] Adult PT Treatment:                                                DATE: 01/17/2022 ?Therapeutic Exercise: ?Supine sciatic nerve glide x 10 R ?LTR x 10 ?Supine piriformis stretch x 30" ?Supine clamshell x 10 GTB ?Supine PPT x 10 - 5" ? ?PATIENT EDUCATION:  ?Education details: eval findings, FOTO, HEP, POC ?Person educated: Patient ?Education method: Explanation, Demonstration, and Handouts ?Education comprehension: verbalized understanding and returned demonstration ? ? ?HOME EXERCISE PROGRAM: ?Access Code: [email or web address] ?URL: [email or web address] ?Date: 01/17/2022 ?Prepared by: Edwinna Areola ? ?Exercises ?Supine Sciatic Nerve Glide - 1-2 x daily - 7 x weekly - 2 sets - 10 reps ?Supine Lower Trunk Rotation - 1-2 x daily - 7 x weekly - 2 sets - 10 reps ?Supine Piriformis Stretch with Leg Straight - 1-2 x daily - 7 x weekly - 2 reps - 30 sec hold ?Hooklying Clamshell with Resistance - 1-2 x daily - 7 x weekly - 3 sets - 10 reps ?Supine Posterior Pelvic Tilt - 1-2 x daily - 7 x weekly - 2 sets - 10 reps - 5 sec hold ? ? ?ASSESSMENT: ? ?CLINICAL IMPRESSION: ?Patient is a 46 y.o. F who was seen today for physical therapy evaluation and treatment for chronic lower back and R LE pain. Physical findings are consistent with MD impression, as pt demonstrates positive neural tension provocation tests, lumbar paraspinal pain, proximal hip weakness, and decreased mobility. Her 30 Second Sit to Stand tests indicates she is operating at a slightly higher risk for falls and impaired mobility. Pt would benefit from skilled PT services working on improving core and proximal hip strength in order to improve comfort, decrease  pain, and increase functional ability.  ? ? ?OBJECTIVE IMPAIRMENTS decreased balance, decreased endurance, decreased  mobility, difficulty walking, decreased ROM, decreased strength, and pain.  ? ?ACTIVITY LIMITATIONS community activity, occupation, and yard work.  ? ?PERSONAL FACTORS Fitness, Time since onset of injury/illness/exacerbation, and 1-2 comorbidities: HTN, Graves disease   are also affecting patient's functional outcome.  ? ? ?REHAB POTENTIAL: Good ? ?CLINICAL DECISION MAKING: Evolving/moderate complexity ? ?EVALUATION COMPLEXITY: Moderate ? ? ?GOALS: ?Goals reviewed with patient? No ? ?SHORT TERM GOALS: ? ?STG Name Target Date Goal status  ?1 Pt will be compliant and knowledgeable with initial HEP for improved comfort and carryover ?Baseline: initial HEP given 02/08/2022 INITIAL  ?2 Pt will self report lower back and R LE pain no greater than 6/10 for improved comfort and functional ability ?Baseline: 10/10 at worst 02/08/2022 INITIAL  ? ?LONG TERM GOALS:  ? ?LTG Name Target Date Goal status  ?1 Pt will self report lower back and R LE pain no greater than 2/10 for improved comfort and functional ability ?Baseline: 10/10 at worst 03/15/2022 INITIAL  ?2 Pt will improve FOTO function score to no less than 63% as proxy for functional improvement ?Baseline: 47% function 03/15/2022 INITIAL  ?3 Pt will increase 30 Second Sit to Stand rep count to no less than 10 reps for improved balance, strength, and functional mobility ?Baseline: 8 reps  03/15/2022 INITIAL  ?4 Pt will improve bilateral hip abductor strength to no less than 4/5 for improved functional mobility and decreased pain ?Baseline: see chart 03/15/2022 INITIAL  ?5 Pt will be able to stand for >45 min without increase in LBP for improved functional ability with work and ADLs ?Baseline: unable 03/15/2022 INITIAL  ? ?PLAN: ?PT FREQUENCY: 2x/week ? ?PT DURATION: 8 weeks ? ?PLANNED INTERVENTIONS: Therapeutic exercises, Therapeutic activity, Neuromuscular re-education, Balance training, Gait training, Patient/Family education, Joint mobilization, Aquatic Therapy, Dry Needling,  Electrical stimulation, Cryotherapy, Moist heat, and Manual therapy ? ?PLAN FOR NEXT SESSION: assess HEP response,  ? ? ?Eloy End, PT ?01/18/2022, 10:13 AM ? ?

## 2022-01-24 ENCOUNTER — Ambulatory Visit: Payer: 59

## 2022-01-24 ENCOUNTER — Other Ambulatory Visit: Payer: Self-pay

## 2022-01-24 DIAGNOSIS — M545 Low back pain, unspecified: Secondary | ICD-10-CM | POA: Diagnosis not present

## 2022-01-24 DIAGNOSIS — G8929 Other chronic pain: Secondary | ICD-10-CM | POA: Diagnosis not present

## 2022-01-24 DIAGNOSIS — M6281 Muscle weakness (generalized): Secondary | ICD-10-CM

## 2022-01-24 DIAGNOSIS — M5441 Lumbago with sciatica, right side: Secondary | ICD-10-CM | POA: Diagnosis not present

## 2022-01-24 DIAGNOSIS — R2689 Other abnormalities of gait and mobility: Secondary | ICD-10-CM | POA: Diagnosis not present

## 2022-01-24 NOTE — Therapy (Signed)
?OUTPATIENT PHYSICAL THERAPY TREATMENT NOTE ? ? ?Patient Name: Alexandra Benjamin ?MRN: [id number] ?DOB:1976/10/03, 46 y.o., female ?Today's Date: 01/24/2022 ? ?PCP: Reece Leader, DO ?REFERRING PROVIDER: Moses Manners, MD ? ? PT End of Session - 01/24/22 1215   ? ? Visit Number 2   ? Number of Visits 17   ? Date for PT Re-Evaluation 03/14/22   ? Authorization Type [facility]   ? PT Start Time 1215   ? PT Stop Time 1255   ? PT Time Calculation (min) 40 min   ? Activity Tolerance Patient tolerated treatment well   ? Behavior During Therapy [facility] for tasks assessed/performed   ? ?  ?  ? ?  ? ? ?Past Medical History:  ?Diagnosis Date  ? Anxiety   ? Exposure to sexually transmitted disease (STD) 09/25/2019  ? Hypertension   ? Thyroid disease   ? ?Past Surgical History:  ?Procedure Laterality Date  ? FINGER FRACTURE SURGERY    ? TUBAL LIGATION    ? ?Patient Active Problem List  ? Diagnosis Date Noted  ? Mood changes 07/11/2021  ? Muscle strain 06/06/2021  ? Pre-diabetes 10/08/2020  ? Chest pain at rest 09/07/2020  ? HTN (hypertension), benign 03/12/2011  ? Obesity 03/12/2011  ? History of Graves' disease 02/12/2007  ? ? ?REFERRING DIAG:  ?M54.41 (ICD-10-CM) - Right-sided low back pain with right-sided sciatica, unspecified chronicity ? ?THERAPY DIAG:  ?Chronic low back pain, unspecified back pain laterality, unspecified whether sciatica present ? ?Muscle weakness (generalized) ? ?PERTINENT HISTORY:  ?None ? ?PRECAUTIONS:  ?None ? ?SUBJECTIVE:  ?Pt presents to PT with continued reports of lower back pain and discomfort. Has been compliant with initial HEP with no adverse effect. Pt is ready to begin PT at this time.  ? ?PAIN:  ?Are you having pain? Yes ?NPRS scale: 4/10 ?Pain location: lower back ?Pain location: lower back, R LE ?PAIN TYPE: aching and sharp ?Pain description: intermittent  ?Aggravating factors: prolonged standing ?Relieving factors: heat,  ? ? ? ?OBJECTIVE:  ?  ?VITALS: ?         BP: 140/88 ?  ?DIAGNOSTIC  FINDINGS:  ?N/A ?  ?PATIENT SURVEYS:  ?FOTO 47% function; 63% predicted ?  ?LE MMT: ?  ?MMT Right ?01/18/2022 Left ?01/18/2022  ?Hip flexion  4/5 4/5  ?Hip extension      ?Hip abduction 3+/5 3+/5  ?Hip adduction      ?Hip external rotation      ?Hip internal rotation      ?Knee extension 5/5 5/5  ?Knee flexion 5/5 5/5  ?Ankle dorsiflexion       ?Ankle plantarflexion      ?Ankle inversion      ?Ankle eversion      ?Grossly      ?(Blank rows = not tested) ?  ? ?FUNCTIONAL TESTS:  ?30 seconds chair stand test: 8 reps ?  ?TODAY'S TREATMENT  ?[facility] Adult PT Treatment:  DATE: 01/24/2022 ?Therapeutic Exercise: ?Seated sciatic nerve glide x 10 R ?LTR x 10 ?Supine PPT x 10 - 5" hold  ?Supine PPT with ball squeeze 2x10 - 5" hold ?Supine PPT w/ march 2x10 GTB ?Supine clamshell 2x15 GTB ?Supine piriformis stretch 2x30" each ?Supine fig 4 stretch x 30" each ?Bridge x 10 - 3" hold ?Supine sciatic nerve glide x 15 R ?STS x 10 - no UE support   ? ?[facility] Adult PT Treatment:  DATE: 01/17/2022 ?Therapeutic Exercise: ?Supine sciatic nerve glide x 10 R ?LTR x  10 ?Supine piriformis stretch x 30" ?Supine clamshell x 10 GTB ?Supine PPT x 10 - 5" ?  ?PATIENT EDUCATION:  ?Education details: eval findings, FOTO, HEP, POC ?Person educated: Patient ?Education method: Explanation, Demonstration, and Handouts ?Education comprehension: verbalized understanding and returned demonstration ?  ?  ?HOME EXERCISE PROGRAM: ?Access Code: [email or web address] ?URL: [email or web address] ?Date: 01/17/2022 ?Prepared by: Edwinna Areola ?  ?Exercises ?Supine Sciatic Nerve Glide - 1-2 x daily - 7 x weekly - 2 sets - 10 reps ?Supine Lower Trunk Rotation - 1-2 x daily - 7 x weekly - 2 sets - 10 reps ?Supine Piriformis Stretch with Leg Straight - 1-2 x daily - 7 x weekly - 2 reps - 30 sec hold ?Hooklying Clamshell with Resistance - 1-2 x daily - 7 x weekly - 3 sets - 10 reps ?Supine Posterior Pelvic Tilt - 1-2 x daily - 7 x weekly - 2 sets - 10 reps - 5 sec hold ?  ?   ?ASSESSMENT: ?  ?CLINICAL IMPRESSION: ?Pt was able to complete all prescribed exercises with no adverse effect or change in baseline. Therapy today focused on improving core and proximal hip muscle strength while improving lumbar AROM and decreasing pain. She is progressing as expected thus far and continues to benefit from skilled PT services. Will continue to progress as tolerated per POC.  ?  ?  ?OBJECTIVE IMPAIRMENTS decreased balance, decreased endurance, decreased mobility, difficulty walking, decreased ROM, decreased strength, and pain.  ?  ?ACTIVITY LIMITATIONS community activity, occupation, and yard work.  ?  ?PERSONAL FACTORS Fitness, Time since onset of injury/illness/exacerbation, and 1-2 comorbidities: HTN, Graves disease   are also affecting patient's functional outcome.  ?  ?  ?GOALS: ?Goals reviewed with patient? No ?  ?SHORT TERM GOALS: ?  ?STG Name Target Date Goal status  ?1 Pt will be compliant and knowledgeable with initial HEP for improved comfort and carryover ?Baseline: initial HEP given 02/08/2022 INITIAL  ?2 Pt will self report lower back and R LE pain no greater than 6/10 for improved comfort and functional ability ?Baseline: 10/10 at worst 02/08/2022 INITIAL  ?  ?LONG TERM GOALS:  ?  ?LTG Name Target Date Goal status  ?1 Pt will self report lower back and R LE pain no greater than 2/10 for improved comfort and functional ability ?Baseline: 10/10 at worst 03/15/2022 INITIAL  ?2 Pt will improve FOTO function score to no less than 63% as proxy for functional improvement ?Baseline: 47% function 03/15/2022 INITIAL  ?3 Pt will increase 30 Second Sit to Stand rep count to no less than 10 reps for improved balance, strength, and functional mobility ?Baseline: 8 reps  03/15/2022 INITIAL  ?4 Pt will improve bilateral hip abductor strength to no less than 4/5 for improved functional mobility and decreased pain ?Baseline: see chart 03/15/2022 INITIAL  ?5 Pt will be able to stand for >45 min without  increase in LBP for improved functional ability with work and ADLs ?Baseline: unable 03/15/2022 INITIAL  ?  ?PLAN: ?PT FREQUENCY: 2x/week ?  ?PT DURATION: 8 weeks ?  ?PLANNED INTERVENTIONS: Therapeutic exercises, Therapeutic activity, Neuromuscular re-education, Balance training, Gait training, Patient/Family education, Joint mobilization, Aquatic Therapy, Dry Needling, Electrical stimulation, Cryotherapy, Moist heat, and Manual therapy ?  ?PLAN FOR NEXT SESSION: assess HEP response,  ? ? ? ?Eloy End, PT ?01/24/2022, 12:56 PM ? ?   ?

## 2022-01-26 ENCOUNTER — Ambulatory Visit: Payer: 59

## 2022-01-26 ENCOUNTER — Telehealth: Payer: Self-pay

## 2022-01-26 NOTE — Telephone Encounter (Signed)
Attempted to call about today's missed appointment. Received a message that said "Your call could not be completed at this time, please try again later." And was not given an option to leave a voicemail. 1st no show. ?

## 2022-01-26 NOTE — Therapy (Incomplete)
?OUTPATIENT PHYSICAL THERAPY TREATMENT NOTE ? ? ?Patient Name: Alexandra Benjamin ?MRN: [id number] ?DOB:August 13, 1976, 46 y.o., female ?Today's Date: 01/26/2022 ? ?PCP: Donney Dice, DO ?REFERRING PROVIDER: Zenia Resides, MD ? ? ? ? ?Past Medical History:  ?Diagnosis Date  ? Anxiety   ? Exposure to sexually transmitted disease (STD) 09/25/2019  ? Hypertension   ? Thyroid disease   ? ?Past Surgical History:  ?Procedure Laterality Date  ? FINGER FRACTURE SURGERY    ? TUBAL LIGATION    ? ?Patient Active Problem List  ? Diagnosis Date Noted  ? Mood changes 07/11/2021  ? Muscle strain 06/06/2021  ? Pre-diabetes 10/08/2020  ? Chest pain at rest 09/07/2020  ? HTN (hypertension), benign 03/12/2011  ? Obesity 03/12/2011  ? History of Graves' disease 02/12/2007  ? ? ?REFERRING DIAG:  ?M54.41 (ICD-10-CM) - Right-sided low back pain with right-sided sciatica, unspecified chronicity ? ?THERAPY DIAG:  ?No diagnosis found. ? ?PERTINENT HISTORY:  ?None ? ?PRECAUTIONS:  ?None ? ?SUBJECTIVE: *** ?Pt presents to PT with continued reports of lower back pain and discomfort. Has been compliant with initial HEP with no adverse effect. Pt is ready to begin PT at this time.  ? ?PAIN:  ?Are you having pain? Yes ?NPRS scale: ***/10 ?Pain location: lower back ?Pain location: lower back, R LE ?PAIN TYPE: aching and sharp ?Pain description: intermittent  ?Aggravating factors: prolonged standing ?Relieving factors: heat,  ? ? ? ?OBJECTIVE:  ?  ?VITALS: ?         BP: 140/88 ?  ?DIAGNOSTIC FINDINGS:  ?N/A ?  ?PATIENT SURVEYS:  ?FOTO 47% function; 63% predicted ?  ?LE MMT: ?  ?MMT Right ?01/18/2022 Left ?01/18/2022  ?Hip flexion  4/5 4/5  ?Hip extension      ?Hip abduction 3+/5 3+/5  ?Hip adduction      ?Hip external rotation      ?Hip internal rotation      ?Knee extension 5/5 5/5  ?Knee flexion 5/5 5/5  ?Ankle dorsiflexion       ?Ankle plantarflexion      ?Ankle inversion      ?Ankle eversion      ?Grossly      ?(Blank rows = not tested) ?  ? ?FUNCTIONAL  TESTS:  ?30 seconds chair stand test: 8 reps ?  ?TODAY'S TREATMENT  ?[facility] Adult PT Treatment:                                                DATE: 01/26/2022 ?Therapeutic Exercise: ?Nustep level 5 x 5 mins while gathering subjective ?LTR x 10 ?Supine PPT x 10 - 5" hold  ?Supine PPT with ball squeeze 2x10 - 5" hold ?Supine PPT w/ march 2x10 GTB ?Supine clamshell 2x15 GTB ?Supine piriformis stretch 2x30" each ?Supine fig 4 stretch x 30" each ?Supine hamstring stretch with strap 2x30" BIL ?Bridge x 10 - 3" hold ?Supine sciatic nerve glide x 15 R ?Sidelying hip abduction x10 BIL ?STS x 10 w/10# KB- no UE support   ? ? ?[facility] Adult PT Treatment:  DATE: 01/24/2022 ?Therapeutic Exercise: ?Seated sciatic nerve glide x 10 R ?LTR x 10 ?Supine PPT x 10 - 5" hold  ?Supine PPT with ball squeeze 2x10 - 5" hold ?Supine PPT w/ march 2x10 GTB ?Supine clamshell 2x15 GTB ?Supine piriformis stretch 2x30" each ?Supine fig 4 stretch x 30"  each ?Bridge x 10 - 3" hold ?Supine sciatic nerve glide x 15 R ?STS x 10 - no UE support   ? ?[facility] Adult PT Treatment:  DATE: 01/17/2022 ?Therapeutic Exercise: ?Supine sciatic nerve glide x 10 R ?LTR x 10 ?Supine piriformis stretch x 30" ?Supine clamshell x 10 GTB ?Supine PPT x 10 - 5" ?  ?PATIENT EDUCATION:  ?Education details: eval findings, FOTO, HEP, POC ?Person educated: Patient ?Education method: Explanation, Demonstration, and Handouts ?Education comprehension: verbalized understanding and returned demonstration ?  ?  ?HOME EXERCISE PROGRAM: ?Access Code: [email or web address] ?URL: [email or web address] ?Date: 01/17/2022 ?Prepared by: Octavio Manns ?  ?Exercises ?Supine Sciatic Nerve Glide - 1-2 x daily - 7 x weekly - 2 sets - 10 reps ?Supine Lower Trunk Rotation - 1-2 x daily - 7 x weekly - 2 sets - 10 reps ?Supine Piriformis Stretch with Leg Straight - 1-2 x daily - 7 x weekly - 2 reps - 30 sec hold ?Hooklying Clamshell with Resistance - 1-2 x daily - 7 x weekly - 3 sets - 10 reps ?Supine Posterior  Pelvic Tilt - 1-2 x daily - 7 x weekly - 2 sets - 10 reps - 5 sec hold ?  ?  ?ASSESSMENT: ?  ?CLINICAL IMPRESSION: ?*** ? ?Pt was able to complete all prescribed exercises with no adverse effect or change in baseline. Therapy today focused on improving core and proximal hip muscle strength while improving lumbar AROM and decreasing pain. She is progressing as expected thus far and continues to benefit from skilled PT services. Will continue to progress as tolerated per POC.  ?  ?  ?OBJECTIVE IMPAIRMENTS decreased balance, decreased endurance, decreased mobility, difficulty walking, decreased ROM, decreased strength, and pain.  ?  ?ACTIVITY LIMITATIONS community activity, occupation, and yard work.  ?  ?PERSONAL FACTORS Fitness, Time since onset of injury/illness/exacerbation, and 1-2 comorbidities: HTN, Graves disease   are also affecting patient's functional outcome.  ?  ?  ?GOALS: ?Goals reviewed with patient? No ?  ?SHORT TERM GOALS: ?  ?STG Name Target Date Goal status  ?1 Pt will be compliant and knowledgeable with initial HEP for improved comfort and carryover ?Baseline: initial HEP given 02/08/2022 INITIAL  ?2 Pt will self report lower back and R LE pain no greater than 6/10 for improved comfort and functional ability ?Baseline: 10/10 at worst 02/08/2022 INITIAL  ?  ?LONG TERM GOALS:  ?  ?LTG Name Target Date Goal status  ?1 Pt will self report lower back and R LE pain no greater than 2/10 for improved comfort and functional ability ?Baseline: 10/10 at worst 03/15/2022 INITIAL  ?2 Pt will improve FOTO function score to no less than 63% as proxy for functional improvement ?Baseline: 47% function 03/15/2022 INITIAL  ?3 Pt will increase 30 Second Sit to Stand rep count to no less than 10 reps for improved balance, strength, and functional mobility ?Baseline: 8 reps  03/15/2022 INITIAL  ?4 Pt will improve bilateral hip abductor strength to no less than 4/5 for improved functional mobility and decreased pain ?Baseline:  see chart 03/15/2022 INITIAL  ?5 Pt will be able to stand for >45 min without increase in LBP for improved functional ability with work and ADLs ?Baseline: unable 03/15/2022 INITIAL  ?  ?PLAN: ?PT FREQUENCY: 2x/week ?  ?PT DURATION: 8 weeks ?  ?PLANNED INTERVENTIONS: Therapeutic exercises, Therapeutic activity, Neuromuscular re-education, Balance training, Gait training, Patient/Family education, Joint mobilization, Aquatic Therapy, Dry Needling, Electrical stimulation, Cryotherapy, Moist heat, and Manual therapy ?  ?  PLAN FOR NEXT SESSION: assess HEP response,  ? ? ? ?Evelene Croon, PTA ?01/26/2022, 11:20 AM ? ?   ?

## 2022-01-30 ENCOUNTER — Other Ambulatory Visit: Payer: Self-pay

## 2022-01-30 ENCOUNTER — Ambulatory Visit: Payer: 59

## 2022-01-30 DIAGNOSIS — R2689 Other abnormalities of gait and mobility: Secondary | ICD-10-CM | POA: Diagnosis not present

## 2022-01-30 DIAGNOSIS — M6281 Muscle weakness (generalized): Secondary | ICD-10-CM | POA: Diagnosis not present

## 2022-01-30 DIAGNOSIS — M5441 Lumbago with sciatica, right side: Secondary | ICD-10-CM | POA: Diagnosis not present

## 2022-01-30 DIAGNOSIS — M545 Low back pain, unspecified: Secondary | ICD-10-CM | POA: Diagnosis not present

## 2022-01-30 DIAGNOSIS — G8929 Other chronic pain: Secondary | ICD-10-CM | POA: Diagnosis not present

## 2022-01-30 NOTE — Therapy (Signed)
?OUTPATIENT PHYSICAL THERAPY TREATMENT NOTE ? ? ?Patient Name: Alexandra Benjamin ?MRN: [id number] ?DOB:12/13/1975, 46 y.o., female ?Today's Date: 01/30/2022 ? ?PCP: Donney Dice, DO ?REFERRING PROVIDER: Donney Dice, DO ? ? PT End of Session - 01/30/22 1213   ? ? Visit Number 3   ? Number of Visits 17   ? Date for PT Re-Evaluation 03/14/22   ? Authorization Type [facility]   ? PT Start Time 1215   ? PT Stop Time [id number]   ? PT Time Calculation (min) 41 min   ? Activity Tolerance Patient tolerated treatment well   ? Behavior During Therapy [facility] for tasks assessed/performed   ? ?  ?  ? ?  ? ? ? ?Past Medical History:  ?Diagnosis Date  ? Anxiety   ? Exposure to sexually transmitted disease (STD) 09/25/2019  ? Hypertension   ? Thyroid disease   ? ?Past Surgical History:  ?Procedure Laterality Date  ? FINGER FRACTURE SURGERY    ? TUBAL LIGATION    ? ?Patient Active Problem List  ? Diagnosis Date Noted  ? Mood changes 07/11/2021  ? Muscle strain 06/06/2021  ? Pre-diabetes 10/08/2020  ? Chest pain at rest 09/07/2020  ? HTN (hypertension), benign 03/12/2011  ? Obesity 03/12/2011  ? History of Graves' disease 02/12/2007  ? ? ?REFERRING DIAG:  ?M54.41 (ICD-10-CM) - Right-sided low back pain with right-sided sciatica, unspecified chronicity ? ?THERAPY DIAG:  ?Chronic low back pain, unspecified back pain laterality, unspecified whether sciatica present ? ?Muscle weakness (generalized) ? ?PERTINENT HISTORY:  ?None ? ?PRECAUTIONS:  ?None ? ?SUBJECTIVE:  ?Pt presents to PT with continued reports of lower back pain and discomfort. Has been compliant with HEP with no adverse effect. Pt is ready to begin PT a this time.  ? ?PAIN:  ?Are you having pain? Yes ?NPRS scale: 6/10 ?Pain location: lower back ?Pain location: lower back, R LE ?PAIN TYPE: aching and sharp ?Pain description: intermittent  ?Aggravating factors: prolonged standing ?Relieving factors: heat,  ? ? ? ?OBJECTIVE:  ?    ?PATIENT SURVEYS:  ?FOTO 47% function; 63% predicted ?  ?LE  MMT: ?  ?MMT Right ?01/18/2022 Left ?01/18/2022  ?Hip flexion  4/5 4/5  ?Hip extension      ?Hip abduction 3+/5 3+/5  ?Hip adduction      ?Hip external rotation      ?Hip internal rotation      ?Knee extension 5/5 5/5  ?Knee flexion 5/5 5/5  ?Ankle dorsiflexion       ?Ankle plantarflexion      ?Ankle inversion      ?Ankle eversion      ?Grossly      ?(Blank rows = not tested) ?  ? ?FUNCTIONAL TESTS:  ?30 seconds chair stand test: 8 reps ?  ?TODAY'S TREATMENT  ?[facility] Adult PT Treatment:  DATE: 01/30/2022 ?Therapeutic Exercise: ?NuStep lvl 5 x 5 min while taking subjective ?LTR x 10 ?Supine PPT x 10 - 5" hold  ?Supine PPT with ball squeeze 2x10 - 5" hold ?Supine PPT w/ march 2x10 GTB ?Supine clamshell 2x15 GTB ?Supine fig 4 stretch x 30" each ?Bridge 2x10 - 3" hold ?Supine DKTC on physioball x 10 - 5" hold ? ?[facility] Adult PT Treatment:  DATE: 01/24/2022 ?Therapeutic Exercise: ?Seated sciatic nerve glide x 10 R ?LTR x 10 ?Supine PPT x 10 - 5" hold  ?Supine PPT with ball squeeze 2x10 - 5" hold ?Supine PPT w/ march 2x10 GTB ?Supine clamshell 2x15 GTB ?Supine piriformis  stretch 2x30" each ?Supine fig 4 stretch x 30" each ?Bridge x 10 - 3" hold ?Supine sciatic nerve glide x 15 R ?STS x 10 - no UE support   ? ?[facility] Adult PT Treatment:  DATE: 01/17/2022 ?Therapeutic Exercise: ?Supine sciatic nerve glide x 10 R ?LTR x 10 ?Supine piriformis stretch x 30" ?Supine clamshell x 10 GTB ?Supine PPT x 10 - 5" ?  ?PATIENT EDUCATION:  ?Education details: eval findings, FOTO, HEP, POC ?Person educated: Patient ?Education method: Explanation, Demonstration, and Handouts ?Education comprehension: verbalized understanding and returned demonstration ?  ?  ?HOME EXERCISE PROGRAM: ?Access Code: [email or web address] ?URL: [email or web address] ?Date: 01/17/2022 ?Prepared by: Octavio Manns ?  ?Exercises ?Supine Sciatic Nerve Glide - 1-2 x daily - 7 x weekly - 2 sets - 10 reps ?Supine Lower Trunk Rotation - 1-2 x daily - 7 x weekly - 2 sets - 10  reps ?Supine Piriformis Stretch with Leg Straight - 1-2 x daily - 7 x weekly - 2 reps - 30 sec hold ?Hooklying Clamshell with Resistance - 1-2 x daily - 7 x weekly - 3 sets - 10 reps ?Supine Posterior Pelvic Tilt - 1-2 x daily - 7 x weekly - 2 sets - 10 reps - 5 sec hold ?  ?  ?ASSESSMENT: ?  ?CLINICAL IMPRESSION: ?Pt was again able to complete all prescribed exercises with no adverse effect or increase in pain. Therapy today continued to focus on improving core and proximal hip strength in order to reduce lower back pain. She is progressing as expected thus far with therapy and will continue to be seen and progressed as tolerated per POC.  ?  ?  ?OBJECTIVE IMPAIRMENTS decreased balance, decreased endurance, decreased mobility, difficulty walking, decreased ROM, decreased strength, and pain.  ?  ?ACTIVITY LIMITATIONS community activity, occupation, and yard work.  ?  ?PERSONAL FACTORS Fitness, Time since onset of injury/illness/exacerbation, and 1-2 comorbidities: HTN, Graves disease   are also affecting patient's functional outcome.  ?  ?  ?GOALS: ?Goals reviewed with patient? No ?  ?SHORT TERM GOALS: ?  ?STG Name Target Date Goal status  ?1 Pt will be compliant and knowledgeable with initial HEP for improved comfort and carryover ?Baseline: initial HEP given 02/08/2022 INITIAL  ?2 Pt will self report lower back and R LE pain no greater than 6/10 for improved comfort and functional ability ?Baseline: 10/10 at worst 02/08/2022 INITIAL  ?  ?LONG TERM GOALS:  ?  ?LTG Name Target Date Goal status  ?1 Pt will self report lower back and R LE pain no greater than 2/10 for improved comfort and functional ability ?Baseline: 10/10 at worst 03/15/2022 INITIAL  ?2 Pt will improve FOTO function score to no less than 63% as proxy for functional improvement ?Baseline: 47% function 03/15/2022 INITIAL  ?3 Pt will increase 30 Second Sit to Stand rep count to no less than 10 reps for improved balance, strength, and functional  mobility ?Baseline: 8 reps  03/15/2022 INITIAL  ?4 Pt will improve bilateral hip abductor strength to no less than 4/5 for improved functional mobility and decreased pain ?Baseline: see chart 03/15/2022 INITIAL  ?5 Pt will be able to stand for >45 min without increase in LBP for improved functional ability with work and ADLs ?Baseline: unable 03/15/2022 INITIAL  ?  ?PLAN: ?PT FREQUENCY: 2x/week ?  ?PT DURATION: 8 weeks ?  ?PLANNED INTERVENTIONS: Therapeutic exercises, Therapeutic activity, Neuromuscular re-education, Balance training, Gait training, Patient/Family education, Joint mobilization, Aquatic Therapy, Dry Needling, Electrical stimulation,  Cryotherapy, Moist heat, and Manual therapy ?  ?PLAN FOR NEXT SESSION: assess HEP response,  ? ? ? ?Ward Chatters, PT ?01/30/2022, 12:58 PM ? ?   ?

## 2022-01-31 NOTE — Therapy (Signed)
?OUTPATIENT PHYSICAL THERAPY TREATMENT NOTE ? ? ?Patient Name: Alexandra Benjamin ?MRN: [id number] ?DOB:10-Oct-1976, 46 y.o., female, female ?Today's Date: 02/01/2022 ? ?PCP: Reece LeaderGanta, Anupa, DO ?REFERRING PROVIDER: Moses MannersHensel, William A, MD ? ? PT End of Session - 02/01/22 1220   ? ? Visit Number 4   ? Number of Visits 17   ? Date for PT Re-Evaluation 03/14/22   ? Authorization Type [facility]   ? PT Start Time 1218   ? PT Stop Time 1258   ? PT Time Calculation (min) 40 min   ? Activity Tolerance Patient tolerated treatment well   ? Behavior [facility] for tasks assessed/performed   ? ?  ?  ? ?  ? ? ? ? ?Past Medical History:  ?Diagnosis Date  ? Anxiety   ? Exposure to sexually transmitted disease (STD) 09/25/2019  ? Hypertension   ? Thyroid disease   ? ?Past Surgical History:  ?Procedure Laterality Date  ? FINGER FRACTURE SURGERY    ? TUBAL LIGATION    ? ?Patient Active Problem List  ? Diagnosis Date Noted  ? Mood changes 07/11/2021  ? Muscle strain 06/06/2021  ? Pre-diabetes 10/08/2020  ? Chest pain at rest 09/07/2020  ? HTN (hypertension), benign 03/12/2011  ? Obesity 03/12/2011  ? History of Graves' disease 02/12/2007  ? ? ?REFERRING DIAG:  ?M54.41 (ICD-10-CM) - Right-sided low back pain with right-sided sciatica, unspecified chronicity ? ?THERAPY DIAG:  ?Chronic low back pain, unspecified back pain laterality, unspecified whether sciatica present ? ?Muscle weakness (generalized) ? ?PERTINENT HISTORY:  ?None ? ?PRECAUTIONS:  ?None ? ?SUBJECTIVE:  ?Pt presents to PT with reports of continued lower back and R LE pain. She notes that she had to stand for a long period of time this morning, noticed increased pain due to this. Pt is ready to begin PT treatment at this time.  ? ?PAIN:  ?Are you having pain? Yes ?NPRS scale: 6/10 ?Pain location: lower back ?Pain location: lower back, R LE ?PAIN TYPE: aching and sharp ?Pain description: intermittent  ?Aggravating factors: prolonged standing ?Relieving factors: heat,  ? ? ? ?OBJECTIVE:   ?    ?PATIENT SURVEYS:  ?FOTO 47% function; 63% predicted ?  ?LE MMT: ?  ?MMT Right ?01/18/2022 Left ?01/18/2022  ?Hip flexion  4/5 4/5  ?Hip extension      ?Hip abduction 3+/5 3+/5  ?Hip adduction      ?Hip external rotation      ?Hip internal rotation      ?Knee extension 5/5 5/5  ?Knee flexion 5/5 5/5  ?Ankle dorsiflexion       ?Ankle plantarflexion      ?Ankle inversion      ?Ankle eversion      ?Grossly      ?(Blank rows = not tested) ?  ? ?FUNCTIONAL TESTS:  ?30 seconds chair stand test: 8 reps ?  ?TODAY'S TREATMENT  ?[facility]:  DATE: 02/01/2022 ?Therapeutic Exercise: ?NuStep lvl 6 x 3 min while taking subjective ?LTR x 10 ?Supine PPT x 10 - 5" hold  ?Supine PPT with ball squeeze 2x10 - 5" hold ?Supine PPT w/ march x 20 GTB ?Supine clamshell 2x20 GTB ?Abdominal curl up 2x10 ?Supine fig 4 stretch x 30" each ?Bridge 2x10 - 3" hold ?Supine SLR 2x10 L, 2x5 R ?Supine DKTC on physioball x 10 - 5" hold ? ?[facility]  [facility]: 01[facility]30/20[facility] ?Therapeutic Exercise: ?NuStep lvl 5 x 5 min while taking subjective ?LTR x 10 ?  Supine PPT x 10 - 5" hold  ?Supine PPT with ball squeeze 2x10 - 5" hold ?Supine PPT w/ march 2x10 GTB ?Supine clamshell 2x15 GTB ?Supine fig 4 stretch x 30" each ?Bridge 2x10 - 3" hold ?Supine DKTC on physioball x 10 - 5" hold ? ?[facility] Adult PT Treatment:  DATE: 01/24/2022 ?Therapeutic Exercise: ?Seated sciatic nerve glide x 10 R ?LTR x 10 ?Supine PPT x 10 - 5" hold  ?Supine PPT with ball squeeze 2x10 - 5" hold ?Supine PPT w/ march 2x10 GTB ?Supine clamshell 2x15 GTB ?Supine piriformis stretch 2x30" each ?Supine fig 4 stretch x 30" each ?Bridge x 10 - 3" hold ?Supine sciatic nerve glide x 15 R ?STS x 10 - no UE support   ? ? ?PATIENT EDUCATION:  ?Education details: eval findings, FOTO, HEP, POC ?Person educated: Patient ?Education method: Explanation, Demonstration, and Handouts ?Education comprehension: verbalized understanding and returned demonstration ?  ?  ?HOME EXERCISE  PROGRAM: ?Access Code: [email or web address] ?URL: [email or web address] ?Date: 02/01/2022 ?Prepared by: Edwinna Areola ? ?Exercises ?Supine Sciatic Nerve Glide - 1-2 x daily - 7 x weekly - 2 sets - 10 reps ?Supine Lower Trunk Rotation - 1-2 x daily - 7 x weekly - 2 sets - 10 reps ?Supine Piriformis Stretch with Leg Straight - 1-2 x daily - 7 x weekly - 2 reps - 30 sec hold ?Hooklying Clamshell with Resistance - 1-2 x daily - 7 x weekly - 3 sets - 10 reps ?Supine Posterior Pelvic Tilt - 1-2 x daily - 7 x weekly - 2 sets - 10 reps - 5 sec hold ?Active Straight Leg Raise with Quad Set - 1 x daily - 7 x weekly - 2 sets - 10 reps ?  ?  ?ASSESSMENT: ?  ?CLINICAL IMPRESSION: ?Pt was able to complete all prescribed exercises with no adverse effect or increase in pain. Therapy today focused on improving core and proximal hip strength in order to decrease pain and improve mobility. Pt is progressing well with therapy, does continued to have proximal hip weakness as evidenced by difficulty with SLR. Pt should continue to be seen and progressed as tolerated per POC. ?  ?  ?OBJECTIVE IMPAIRMENTS decreased balance, decreased endurance, decreased mobility, difficulty walking, decreased ROM, decreased strength, and pain.  ?  ?ACTIVITY LIMITATIONS community activity, occupation, and yard work.  ?  ?PERSONAL FACTORS Fitness, Time since onset of injury/illness/exacerbation, and 1-2 comorbidities: HTN, Graves disease   are also affecting patient's functional outcome.  ?  ?  ?GOALS: ?Goals reviewed with patient? No ?  ?SHORT TERM GOALS: ?  ?STG Name Target Date Goal status  ?1 Pt will be compliant and knowledgeable with initial HEP for improved comfort and carryover ?Baseline: initial HEP given 02/08/2022 INITIAL  ?2 Pt will self report lower back and R LE pain no greater than 6/10 for improved comfort and functional ability ?Baseline: 10/10 at worst 02/08/2022 INITIAL  ?  ?LONG TERM GOALS:  ?  ?LTG Name Target Date Goal status  ?1 Pt  will self report lower back and R LE pain no greater than 2/10 for improved comfort and functional ability ?Baseline: 10/10 at worst 03/15/2022 INITIAL  ?2 Pt will improve FOTO function score to no less than 63% as proxy for functional improvement ?Baseline: 47% function 03/15/2022 INITIAL  ?3 Pt will increase 30 Second Sit to Stand rep count to no less than 10 reps for improved balance, strength, and functional mobility ?Baseline: 8 reps  03/15/2022 INITIAL  ?4  Pt will improve bilateral hip abductor strength to no less than 4/5 for improved functional mobility and decreased pain ?Baseline: see chart 03/15/2022 INITIAL  ?5 Pt will be able to stand for >45 min without increase in LBP for improved functional ability with work and ADLs ?Baseline: unable 03/15/2022 INITIAL  ?  ?PLAN: ?PT FREQUENCY: 2x/week ?  ?PT DURATION: 8 weeks ?  ?PLANNED INTERVENTIONS: Therapeutic exercises, Therapeutic activity, Neuromuscular re-education, Balance training, Gait training, Patient/Family education, Joint mobilization, Aquatic Therapy, Dry Needling, Electrical stimulation, Cryotherapy, Moist heat, and Manual therapy ?  ?PLAN FOR NEXT SESSION: assess HEP response,  ? ? ? ?Eloy End, PT ?02/01/2022, 12:58 PM ? ?   ?

## 2022-02-01 ENCOUNTER — Other Ambulatory Visit: Payer: Self-pay

## 2022-02-01 ENCOUNTER — Ambulatory Visit: Payer: 59

## 2022-02-01 DIAGNOSIS — M545 Low back pain, unspecified: Secondary | ICD-10-CM | POA: Diagnosis not present

## 2022-02-01 DIAGNOSIS — G8929 Other chronic pain: Secondary | ICD-10-CM | POA: Diagnosis not present

## 2022-02-01 DIAGNOSIS — R2689 Other abnormalities of gait and mobility: Secondary | ICD-10-CM | POA: Diagnosis not present

## 2022-02-01 DIAGNOSIS — M6281 Muscle weakness (generalized): Secondary | ICD-10-CM | POA: Diagnosis not present

## 2022-02-01 DIAGNOSIS — M5441 Lumbago with sciatica, right side: Secondary | ICD-10-CM | POA: Diagnosis not present

## 2022-02-06 ENCOUNTER — Ambulatory Visit: Payer: 59

## 2022-02-06 ENCOUNTER — Other Ambulatory Visit: Payer: Self-pay

## 2022-02-06 DIAGNOSIS — M545 Low back pain, unspecified: Secondary | ICD-10-CM | POA: Diagnosis not present

## 2022-02-06 DIAGNOSIS — M6281 Muscle weakness (generalized): Secondary | ICD-10-CM | POA: Diagnosis not present

## 2022-02-06 DIAGNOSIS — M5441 Lumbago with sciatica, right side: Secondary | ICD-10-CM | POA: Diagnosis not present

## 2022-02-06 DIAGNOSIS — R2689 Other abnormalities of gait and mobility: Secondary | ICD-10-CM | POA: Diagnosis not present

## 2022-02-06 DIAGNOSIS — G8929 Other chronic pain: Secondary | ICD-10-CM | POA: Diagnosis not present

## 2022-02-06 NOTE — Therapy (Signed)
?OUTPATIENT PHYSICAL THERAPY TREATMENT NOTE ? ? ?Patient Name: Alexandra Benjamin ?MRN: [id number] ?DOB:1976-03-10, 46 y.o., female ?Today's Date: 02/06/2022 ? ?PCP: Donney Dice, DO ?REFERRING PROVIDER: Donney Dice, DO ? ? PT End of Session - 02/06/22 1218   ? ? Visit Number 5   ? Number of Visits 17   ? Date for PT Re-Evaluation 03/14/22   ? Authorization Type [facility]   ? PT Start Time 1218   ? PT Stop Time [id number]   ? PT Time Calculation (min) 38 min   ? Activity Tolerance Patient tolerated treatment well   ? Behavior During Therapy [facility] for tasks assessed/performed   ? ?  ?  ? ?  ? ? ? ? ? ?Past Medical History:  ?Diagnosis Date  ? Anxiety   ? Exposure to sexually transmitted disease (STD) 09/25/2019  ? Hypertension   ? Thyroid disease   ? ?Past Surgical History:  ?Procedure Laterality Date  ? FINGER FRACTURE SURGERY    ? TUBAL LIGATION    ? ?Patient Active Problem List  ? Diagnosis Date Noted  ? Mood changes 07/11/2021  ? Muscle strain 06/06/2021  ? Pre-diabetes 10/08/2020  ? Chest pain at rest 09/07/2020  ? HTN (hypertension), benign 03/12/2011  ? Obesity 03/12/2011  ? History of Graves' disease 02/12/2007  ? ? ?REFERRING DIAG:  ?M54.41 (ICD-10-CM) - Right-sided low back pain with right-sided sciatica, unspecified chronicity ? ?THERAPY DIAG:  ?Chronic low back pain, unspecified back pain laterality, unspecified whether sciatica present ? ?Muscle weakness (generalized) ? ?PERTINENT HISTORY:  ?None ? ?PRECAUTIONS:  ?None ? ?SUBJECTIVE:  ?Pt presents to PT with reports of decreased lower back and R LE pain. Notes she was hurting a little more yesterday, but took a muscle relaxor which seemed to help. Pt has been compliant with HEP with no adverse effect. Pt is ready to begin PT at this time. ? ?PAIN:  ?Are you having pain? Yes ?NPRS scale: 6/10 ?Pain location: lower back ?Pain location: lower back, R LE ?PAIN TYPE: aching and sharp ?Pain description: intermittent  ?Aggravating factors: prolonged standing ?Relieving  factors: heat,  ? ? ? ?OBJECTIVE:  ?    ?PATIENT SURVEYS:  ?FOTO 47% function; 63% predicted ?  ?LE MMT: ?  ?MMT Right ?01/18/2022 Left ?01/18/2022  ?Hip flexion  4/5 4/5  ?Hip extension      ?Hip abduction 3+/5 3+/5  ?Hip adduction      ?Hip external rotation      ?Hip internal rotation      ?Knee extension 5/5 5/5  ?Knee flexion 5/5 5/5  ?Ankle dorsiflexion       ?Ankle plantarflexion      ?Ankle inversion      ?Ankle eversion      ?Grossly      ?(Blank rows = not tested) ?  ? ?FUNCTIONAL TESTS:  ?30 seconds chair stand test: 8 reps ?  ?TODAY'S TREATMENT  ?[facility] Adult PT Treatment:  DATE: 02/06/2022 ?Therapeutic Exercise: ?NuStep lvl 6 x 3 min while taking subjective ?LTR x 10 ?Supine PPT x 10 - 5" hold  ?Supine PPT w/ march x 20 BTB ?Supine clamshell x 20 BTB ?Abdominal curl up 2x10 ?Bridge 2x10 - 3" hold ?Supine SLR 3x5 each ?Supine DKTC on physioball x 10 - 5" hold ? ?[facility] Adult PT Treatment:  DATE: 02/01/2022 ?Therapeutic Exercise: ?NuStep lvl 6 x 3 min while taking subjective ?LTR x 10 ?Supine PPT x 10 - 5" hold  ?Supine PPT with ball squeeze  2x10 - 5" hold ?Supine PPT w/ march x 20 GTB ?Supine clamshell 2x20 GTB ?Abdominal curl up 2x10 ?Supine fig 4 stretch x 30" each ?Bridge 2x10 - 3" hold ?Supine SLR 2x10 L, 2x5 R ?Supine DKTC on physioball x 10 - 5" hold ? ?[facility] Adult PT Treatment:  DATE: 01/30/2022 ?Therapeutic Exercise: ?NuStep lvl 5 x 5 min while taking subjective ?LTR x 10 ?Supine PPT x 10 - 5" hold  ?Supine PPT with ball squeeze 2x10 - 5" hold ?Supine PPT w/ march 2x10 GTB ?Supine clamshell 2x15 GTB ?Supine fig 4 stretch x 30" each ?Bridge 2x10 - 3" hold ?Supine DKTC on physioball x 10 - 5" hold ? ?PATIENT EDUCATION:  ?Education details: eval findings, FOTO, HEP, POC ?Person educated: Patient ?Education method: Explanation, Demonstration, and Handouts ?Education comprehension: verbalized understanding and returned demonstration ?  ?  ?HOME EXERCISE PROGRAM: ?Access Code: [email or web address] ?URL:  [email or web address] ?Date: 02/01/2022 ?Prepared by: Octavio Manns ? ?Exercises ?Supine Sciatic Nerve Glide - 1-2 x daily - 7 x weekly - 2 sets - 10 reps ?Supine Lower Trunk Rotation - 1-2 x daily - 7 x weekly - 2 sets - 10 reps ?Supine Piriformis Stretch with Leg Straight - 1-2 x daily - 7 x weekly - 2 reps - 30 sec hold ?Hooklying Clamshell with Resistance - 1-2 x daily - 7 x weekly - 3 sets - 10 reps ?Supine Posterior Pelvic Tilt - 1-2 x daily - 7 x weekly - 2 sets - 10 reps - 5 sec hold ?Active Straight Leg Raise with Quad Set - 1 x daily - 7 x weekly - 2 sets - 10 reps ?  ?  ?ASSESSMENT: ?  ?CLINICAL IMPRESSION: ?Pt was able to complete all prescribed exercises with no adverse effect or increase in pain. Therapy today focused on improving core and proximal hip muscle strength in order to decrease pain and improve functional mobility. Pt continues to benefit from skilled PT services and will continue to be seen and progressed as tolerated per POC.  ?  ?  ?OBJECTIVE IMPAIRMENTS decreased balance, decreased endurance, decreased mobility, difficulty walking, decreased ROM, decreased strength, and pain.  ?  ?ACTIVITY LIMITATIONS community activity, occupation, and yard work.  ?  ?PERSONAL FACTORS Fitness, Time since onset of injury/illness/exacerbation, and 1-2 comorbidities: HTN, Graves disease   are also affecting patient's functional outcome.  ?  ?  ?GOALS: ?Goals reviewed with patient? No ?  ?SHORT TERM GOALS: ?  ?STG Name Target Date Goal status  ?1 Pt will be compliant and knowledgeable with initial HEP for improved comfort and carryover ?Baseline: initial HEP given 02/08/2022 INITIAL  ?2 Pt will self report lower back and R LE pain no greater than 6/10 for improved comfort and functional ability ?Baseline: 10/10 at worst 02/08/2022 INITIAL  ?  ?LONG TERM GOALS:  ?  ?LTG Name Target Date Goal status  ?1 Pt will self report lower back and R LE pain no greater than 2/10 for improved comfort and  functional ability ?Baseline: 10/10 at worst 03/15/2022 INITIAL  ?2 Pt will improve FOTO function score to no less than 63% as proxy for functional improvement ?Baseline: 47% function 03/15/2022 INITIAL  ?3 Pt will increase 30 Second Sit to Stand rep count to no less than 10 reps for improved balance, strength, and functional mobility ?Baseline: 8 reps  03/15/2022 INITIAL  ?4 Pt will improve bilateral hip abductor strength to no less than 4/5 for improved functional mobility and decreased pain ?Baseline: see chart  03/15/2022 INITIAL  ?5 Pt will be able to stand for >45 min without increase in LBP for improved functional ability with work and ADLs ?Baseline: unable 03/15/2022 INITIAL  ?  ?PLAN: ?PT FREQUENCY: 2x/week ?  ?PT DURATION: 8 weeks ?  ?PLANNED INTERVENTIONS: Therapeutic exercises, Therapeutic activity, Neuromuscular re-education, Balance training, Gait training, Patient/Family education, Joint mobilization, Aquatic Therapy, Dry Needling, Electrical stimulation, Cryotherapy, Moist heat, and Manual therapy ?  ?PLAN FOR NEXT SESSION: assess HEP response,  ? ? ? ?Ward Chatters, PT ?02/06/2022, 12:57 PM ? ?   ?

## 2022-02-08 ENCOUNTER — Ambulatory Visit: Payer: 59

## 2022-02-08 NOTE — Therapy (Incomplete)
?OUTPATIENT PHYSICAL THERAPY TREATMENT NOTE ? ? ?Patient Name: Alexandra Benjamin ?MRN: [id number] ?DOB:1976/04/16, 46 y.o., female ?Today's Date: 02/08/2022 ? ?PCP: Donney Dice, DO ?REFERRING PROVIDER: Zenia Resides, MD ? ? ? ? ? ? ? ?Past Medical History:  ?Diagnosis Date  ? Anxiety   ? Exposure to sexually transmitted disease (STD) 09/25/2019  ? Hypertension   ? Thyroid disease   ? ?Past Surgical History:  ?Procedure Laterality Date  ? FINGER FRACTURE SURGERY    ? TUBAL LIGATION    ? ?Patient Active Problem List  ? Diagnosis Date Noted  ? Mood changes 07/11/2021  ? Muscle strain 06/06/2021  ? Pre-diabetes 10/08/2020  ? Chest pain at rest 09/07/2020  ? HTN (hypertension), benign 03/12/2011  ? Obesity 03/12/2011  ? History of Graves' disease 02/12/2007  ? ? ?REFERRING DIAG:  ?M54.41 (ICD-10-CM) - Right-sided low back pain with right-sided sciatica, unspecified chronicity ? ?THERAPY DIAG:  ?No diagnosis found. ? ?PERTINENT HISTORY:  ?None ? ?PRECAUTIONS:  ?None ? ?SUBJECTIVE:  ?*** ? ?PAIN:  ?Are you having pain? Yes ?NPRS scale: 6/10 ?Pain location: lower back ?Pain location: lower back, R LE ?PAIN TYPE: aching and sharp ?Pain description: intermittent  ?Aggravating factors: prolonged standing ?Relieving factors: heat,  ? ? ? ?OBJECTIVE:  ?    ?PATIENT SURVEYS:  ?FOTO 47% function; 63% predicted ?  ?LE MMT: ?  ?MMT Right ?01/18/2022 Left ?01/18/2022  ?Hip flexion  4/5 4/5  ?Hip extension      ?Hip abduction 3+/5 3+/5  ?Hip adduction      ?Hip external rotation      ?Hip internal rotation      ?Knee extension 5/5 5/5  ?Knee flexion 5/5 5/5  ?Ankle dorsiflexion       ?Ankle plantarflexion      ?Ankle inversion      ?Ankle eversion      ?Grossly      ?(Blank rows = not tested) ?  ? ?FUNCTIONAL TESTS:  ?30 seconds chair stand test: 8 reps ?  ?TODAY'S TREATMENT  ?[facility] Adult PT Treatment:  DATE: 02/08/2022 ?Therapeutic Exercise: ?NuStep lvl 6 x 3 min while taking subjective ?LTR x 10 ?Supine PPT x 10 - 5" hold  ?Supine PPT w/  march x 20 BTB ?Supine clamshell x 20 BTB ?Abdominal curl up 2x10 ?Bridge 2x10 - 3" hold ?Supine SLR 3x5 each ?Supine DKTC on physioball x 10 - 5" hold ? ?[facility] Adult PT Treatment:  DATE: 02/06/2022 ?Therapeutic Exercise: ?NuStep lvl 6 x 3 min while taking subjective ?LTR x 10 ?Supine PPT x 10 - 5" hold  ?Supine PPT w/ march x 20 BTB ?Supine clamshell x 20 BTB ?Abdominal curl up 2x10 ?Bridge 2x10 - 3" hold ?Supine SLR 3x5 each ?Supine DKTC on physioball x 10 - 5" hold ? ?[facility] Adult PT Treatment:  DATE: 02/01/2022 ?Therapeutic Exercise: ?NuStep lvl 6 x 3 min while taking subjective ?LTR x 10 ?Supine PPT x 10 - 5" hold  ?Supine PPT with ball squeeze 2x10 - 5" hold ?Supine PPT w/ march x 20 GTB ?Supine clamshell 2x20 GTB ?Abdominal curl up 2x10 ?Supine fig 4 stretch x 30" each ?Bridge 2x10 - 3" hold ?Supine SLR 2x10 L, 2x5 R ?Supine DKTC on physioball x 10 - 5" hold ? ?PATIENT EDUCATION:  ?Education details: eval findings, FOTO, HEP, POC ?Person educated: Patient ?Education method: Explanation, Demonstration, and Handouts ?Education comprehension: verbalized understanding and returned demonstration ?  ?  ?HOME EXERCISE PROGRAM: ?Access Code: [email or web address] ?URL: [email or web address] ?  Date: 02/01/2022 ?Prepared by: Octavio Manns ? ?Exercises ?Supine Sciatic Nerve Glide - 1-2 x daily - 7 x weekly - 2 sets - 10 reps ?Supine Lower Trunk Rotation - 1-2 x daily - 7 x weekly - 2 sets - 10 reps ?Supine Piriformis Stretch with Leg Straight - 1-2 x daily - 7 x weekly - 2 reps - 30 sec hold ?Hooklying Clamshell with Resistance - 1-2 x daily - 7 x weekly - 3 sets - 10 reps ?Supine Posterior Pelvic Tilt - 1-2 x daily - 7 x weekly - 2 sets - 10 reps - 5 sec hold ?Active Straight Leg Raise with Quad Set - 1 x daily - 7 x weekly - 2 sets - 10 reps ?  ?  ?ASSESSMENT: ?  ?CLINICAL IMPRESSION: ?*** ?  ?  ?OBJECTIVE IMPAIRMENTS decreased balance, decreased endurance, decreased mobility, difficulty walking, decreased ROM, decreased  strength, and pain.  ?  ?ACTIVITY LIMITATIONS community activity, occupation, and yard work.  ?  ?PERSONAL FACTORS Fitness, Time since onset of injury/illness/exacerbation, and 1-2 comorbidities: HTN, Graves disease   are also affecting patient's functional outcome.  ?  ?  ?GOALS: ?Goals reviewed with patient? No ?  ?SHORT TERM GOALS: ?  ?STG Name Target Date Goal status  ?1 Pt will be compliant and knowledgeable with initial HEP for improved comfort and carryover ?Baseline: initial HEP given 02/08/2022 INITIAL  ?2 Pt will self report lower back and R LE pain no greater than 6/10 for improved comfort and functional ability ?Baseline: 10/10 at worst 02/08/2022 INITIAL  ?  ?LONG TERM GOALS:  ?  ?LTG Name Target Date Goal status  ?1 Pt will self report lower back and R LE pain no greater than 2/10 for improved comfort and functional ability ?Baseline: 10/10 at worst 03/15/2022 INITIAL  ?2 Pt will improve FOTO function score to no less than 63% as proxy for functional improvement ?Baseline: 47% function 03/15/2022 INITIAL  ?3 Pt will increase 30 Second Sit to Stand rep count to no less than 10 reps for improved balance, strength, and functional mobility ?Baseline: 8 reps  03/15/2022 INITIAL  ?4 Pt will improve bilateral hip abductor strength to no less than 4/5 for improved functional mobility and decreased pain ?Baseline: see chart 03/15/2022 INITIAL  ?5 Pt will be able to stand for >45 min without increase in LBP for improved functional ability with work and ADLs ?Baseline: unable 03/15/2022 INITIAL  ?  ?PLAN: ?PT FREQUENCY: 2x/week ?  ?PT DURATION: 8 weeks ?  ?PLANNED INTERVENTIONS: Therapeutic exercises, Therapeutic activity, Neuromuscular re-education, Balance training, Gait training, Patient/Family education, Joint mobilization, Aquatic Therapy, Dry Needling, Electrical stimulation, Cryotherapy, Moist heat, and Manual therapy ?  ?PLAN FOR NEXT SESSION: assess HEP response,  ? ? ? ?Ward Chatters, PT ?02/08/2022, 10:15  AM ? ?   ?

## 2022-02-13 ENCOUNTER — Ambulatory Visit: Payer: 59

## 2022-02-13 ENCOUNTER — Other Ambulatory Visit: Payer: Self-pay

## 2022-02-13 DIAGNOSIS — M5441 Lumbago with sciatica, right side: Secondary | ICD-10-CM | POA: Diagnosis not present

## 2022-02-13 DIAGNOSIS — M6281 Muscle weakness (generalized): Secondary | ICD-10-CM

## 2022-02-13 DIAGNOSIS — R2689 Other abnormalities of gait and mobility: Secondary | ICD-10-CM | POA: Diagnosis not present

## 2022-02-13 DIAGNOSIS — M545 Low back pain, unspecified: Secondary | ICD-10-CM

## 2022-02-13 DIAGNOSIS — G8929 Other chronic pain: Secondary | ICD-10-CM | POA: Diagnosis not present

## 2022-02-13 NOTE — Therapy (Signed)
?OUTPATIENT PHYSICAL THERAPY TREATMENT NOTE ? ? ?Patient Name: Alexandra Benjamin ?MRN: [id number] ?DOB:[birth year], 46 y.o., female ?Today's Date: 02/13/2022 ? ?PCP: Donney Dice, DO ?REFERRING PROVIDER: Donney Dice, DO ? ? PT End of Session - 02/13/22 1219   ? ? Visit Number 6   ? Number of Visits 17   ? Date for PT Re-Evaluation 03/14/22   ? Authorization Type [facility]   ? PT Start Time 1219   ? PT Stop Time 2130   ? PT Time Calculation (min) 38 min   ? Activity Tolerance Patient tolerated treatment well   ? Behavior During Therapy [facility] for tasks assessed/performed   ? ?  ?  ? ?  ? ? ? ? ? ? ?Past Medical History:  ?Diagnosis Date  ? Anxiety   ? Exposure to sexually transmitted disease (STD) 09/25/2019  ? Hypertension   ? Thyroid disease   ? ?Past Surgical History:  ?Procedure Laterality Date  ? FINGER FRACTURE SURGERY    ? TUBAL LIGATION    ? ?Patient Active Problem List  ? Diagnosis Date Noted  ? Mood changes 07/11/2021  ? Muscle strain 06/06/2021  ? Pre-diabetes 10/08/2020  ? Chest pain at rest 09/07/2020  ? HTN (hypertension), benign 03/12/2011  ? Obesity 03/12/2011  ? History of Graves' disease 02/12/2007  ? ? ?REFERRING DIAG:  ?M54.41 (ICD-10-CM) - Right-sided low back pain with right-sided sciatica, unspecified chronicity ? ?THERAPY DIAG:  ?Chronic low back pain, unspecified back pain laterality, unspecified whether sciatica present ? ?Muscle weakness (generalized) ? ?PERTINENT HISTORY:  ?None ? ?PRECAUTIONS:  ?None ? ?SUBJECTIVE:  ?Pt presents to PT with continued reports of lower back pain and discomfort. She had increased pain over the weekend, noted this could have been due to wearing heels over the weekend. Pt is ready to begin PT at this time.  ? ?PAIN:  ?Are you having pain? Yes ?NPRS scale: 6/10 ?Pain location: lower back ?Pain location: lower back, R LE ?PAIN TYPE: aching and sharp ?Pain description: intermittent  ?Aggravating factors: prolonged standing ?Relieving factors: heat,  ? ? ? ?OBJECTIVE:  ?     ?PATIENT SURVEYS:  ?FOTO 47% function; 63% predicted ?  ?LE MMT: ?  ?MMT Right ?01/18/2022 Left ?01/18/2022  ?Hip flexion  4/5 4/5  ?Hip extension      ?Hip abduction 3+/5 3+/5  ?Hip adduction      ?Hip external rotation      ?Hip internal rotation      ?Knee extension 5/5 5/5  ?Knee flexion 5/5 5/5  ?Ankle dorsiflexion       ?Ankle plantarflexion      ?Ankle inversion      ?Ankle eversion      ?Grossly      ?(Blank rows = not tested) ?  ? ?FUNCTIONAL TESTS:  ?30 seconds chair stand test: 8 reps ?  ?TODAY'S TREATMENT  ?[facility] Adult PT Treatment:  DATE: 02/13/2022 ?Therapeutic Exercise: ?Physioball rollout x 15 fwd ?LTR x 10 ?Supine PPT x 10 - 5" hold  ?Supine PPT w/ march x 20 BTB ?Supine clamshell 2x20 BTB ?Abdominal curl up 2x10 ?90/90 hold 3x15" ?Bridge 2x10 - 3" hold ?Supine SLR 3x5 each ?Supine DKTC on physioball x 10 - 5" hold ? ?[facility] Adult PT Treatment:  DATE: 02/08/2022 ?Therapeutic Exercise: ?NuStep lvl 6 x 3 min while taking subjective ?LTR x 10 ?Supine PPT x 10 - 5" hold  ?Supine PPT w/ march x 20 BTB ?Supine clamshell x 20 BTB ?Abdominal curl  up 2x10 ?Bridge 2x10 - 3" hold ?Supine SLR 3x5 each ?Supine DKTC on physioball x 10 - 5" hold ? ?[facility] Adult PT Treatment:  DATE: 02/06/2022 ?Therapeutic Exercise: ?NuStep lvl 6 x 3 min while taking subjective ?LTR x 10 ?Supine PPT x 10 - 5" hold  ?Supine PPT w/ march x 20 BTB ?Supine clamshell x 20 BTB ?Abdominal curl up 2x10 ?Bridge 2x10 - 3" hold ?Supine SLR 3x5 each ?Supine DKTC on physioball x 10 - 5" hold ? ?PATIENT EDUCATION:  ?Education details: eval findings, FOTO, HEP, POC ?Person educated: Patient ?Education method: Explanation, Demonstration, and Handouts ?Education comprehension: verbalized understanding and returned demonstration ?  ?  ?HOME EXERCISE PROGRAM: ?Access Code: [email or web address] ?URL: [email or web address] ?Date: 02/01/2022 ?Prepared by: Octavio Manns ? ?Exercises ?Supine Sciatic Nerve Glide - 1-2 x daily - 7 x weekly - 2 sets - 10 reps ?Supine  Lower Trunk Rotation - 1-2 x daily - 7 x weekly - 2 sets - 10 reps ?Supine Piriformis Stretch with Leg Straight - 1-2 x daily - 7 x weekly - 2 reps - 30 sec hold ?Hooklying Clamshell with Resistance - 1-2 x daily - 7 x weekly - 3 sets - 10 reps ?Supine Posterior Pelvic Tilt - 1-2 x daily - 7 x weekly - 2 sets - 10 reps - 5 sec hold ?Active Straight Leg Raise with Quad Set - 1 x daily - 7 x weekly - 2 sets - 10 reps ?  ?  ?ASSESSMENT: ?  ?CLINICAL IMPRESSION: ?Pt was again able to complete all prescribed exercises with no adverse effect or increase in pain. Therapy today again focused on improving core and hip strength working towards decreasing pain and improving mobility. Pt continues to benefit from skilled PT services working on improving strength, as she continues to have lower back pain with work and desired recreational activity. Will continue to progress as tolerated per POC.  ?  ?  ?OBJECTIVE IMPAIRMENTS decreased balance, decreased endurance, decreased mobility, difficulty walking, decreased ROM, decreased strength, and pain.  ?  ?ACTIVITY LIMITATIONS community activity, occupation, and yard work.  ?  ?PERSONAL FACTORS Fitness, Time since onset of injury/illness/exacerbation, and 1-2 comorbidities: HTN, Graves disease   are also affecting patient's functional outcome.  ?  ?  ?GOALS: ?Goals reviewed with patient? No ?  ?SHORT TERM GOALS: ?  ?STG Name Target Date Goal status  ?1 Pt will be compliant and knowledgeable with initial HEP for improved comfort and carryover ?Baseline: initial HEP given 02/08/2022 MET  ?2 Pt will self report lower back and R LE pain no greater than 6/10 for improved comfort and functional ability ?Baseline: 10/10 at worst 02/08/2022 MET  ?  ?LONG TERM GOALS:  ?  ?LTG Name Target Date Goal status  ?1 Pt will self report lower back and R LE pain no greater than 2/10 for improved comfort and functional ability ?Baseline: 10/10 at worst 03/15/2022 INITIAL  ?2 Pt will improve FOTO function  score to no less than 63% as proxy for functional improvement ?Baseline: 47% function 03/15/2022 INITIAL  ?3 Pt will increase 30 Second Sit to Stand rep count to no less than 10 reps for improved balance, strength, and functional mobility ?Baseline: 8 reps  03/15/2022 INITIAL  ?4 Pt will improve bilateral hip abductor strength to no less than 4/5 for improved functional mobility and decreased pain ?Baseline: see chart 03/15/2022 INITIAL  ?5 Pt will be able to stand for >45 min without increase in LBP for improved functional  ability with work and ADLs ?Baseline: unable 03/15/2022 INITIAL  ?  ?PLAN: ?PT FREQUENCY: 2x/week ?  ?PT DURATION: 8 weeks ?  ?PLANNED INTERVENTIONS: Therapeutic exercises, Therapeutic activity, Neuromuscular re-education, Balance training, Gait training, Patient/Family education, Joint mobilization, Aquatic Therapy, Dry Needling, Electrical stimulation, Cryotherapy, Moist heat, and Manual therapy ?  ?PLAN FOR NEXT SESSION: assess HEP response,  ? ? ? ?Ward Chatters, PT ?02/13/2022, 1:04 PM ? ?   ?

## 2022-02-15 ENCOUNTER — Ambulatory Visit: Payer: 59

## 2022-02-15 NOTE — Therapy (Incomplete)
?OUTPATIENT PHYSICAL THERAPY TREATMENT NOTE ? ? ?Patient Name: Alexandra Benjamin ?MRN: [id number] ?DOB:04/23/1976, 46 y.o., female ?Today's Date: 02/15/2022 ? ?PCP: Donney Dice, DO ?REFERRING PROVIDER: Zenia Resides, MD ? ? ? ? ? ? ? ? ?Past Medical History:  ?Diagnosis Date  ? Anxiety   ? Exposure to sexually transmitted disease (STD) 09/25/2019  ? Hypertension   ? Thyroid disease   ? ?Past Surgical History:  ?Procedure Laterality Date  ? FINGER FRACTURE SURGERY    ? TUBAL LIGATION    ? ?Patient Active Problem List  ? Diagnosis Date Noted  ? Mood changes 07/11/2021  ? Muscle strain 06/06/2021  ? Pre-diabetes 10/08/2020  ? Chest pain at rest 09/07/2020  ? HTN (hypertension), benign 03/12/2011  ? Obesity 03/12/2011  ? History of Graves' disease 02/12/2007  ? ? ?REFERRING DIAG:  ?M54.41 (ICD-10-CM) - Right-sided low back pain with right-sided sciatica, unspecified chronicity ? ?THERAPY DIAG:  ?No diagnosis found. ? ?PERTINENT HISTORY:  ?None ? ?PRECAUTIONS:  ?None ? ?SUBJECTIVE:  ?*** ? ?PAIN:  ?Are you having pain? Yes ?NPRS scale: 6/10 ?Pain location: lower back ?Pain location: lower back, R LE ?PAIN TYPE: aching and sharp ?Pain description: intermittent  ?Aggravating factors: prolonged standing ?Relieving factors: heat,  ? ? ? ?OBJECTIVE:  ?    ?PATIENT SURVEYS:  ?FOTO 47% function; 63% predicted ?  ?LE MMT: ?  ?MMT Right ?01/18/2022 Left ?01/18/2022  ?Hip flexion  4/5 4/5  ?Hip extension      ?Hip abduction 3+/5 3+/5  ?Hip adduction      ?Hip external rotation      ?Hip internal rotation      ?Knee extension 5/5 5/5  ?Knee flexion 5/5 5/5  ?Ankle dorsiflexion       ?Ankle plantarflexion      ?Ankle inversion      ?Ankle eversion      ?Grossly      ?(Blank rows = not tested) ?  ? ?FUNCTIONAL TESTS:  ?30 seconds chair stand test: 8 reps ?  ?TODAY'S TREATMENT  ?[facility] Adult PT Treatment:  DATE: 02/15/2022 ?Therapeutic Exercise: ?Physioball rollout x 15 fwd ?LTR x 10 ?Supine PPT x 10 - 5" hold  ?Supine PPT w/ march x 20  BTB ?Supine clamshell 2x20 BTB ?Abdominal curl up 2x10 ?90/90 hold 3x15" ?Bridge 2x10 - 3" hold ?Supine SLR 3x5 each ?Supine DKTC on physioball x 10 - 5" hold ? ?[facility] Adult PT Treatment:  DATE: 02/13/2022 ?Therapeutic Exercise: ?Physioball rollout x 15 fwd ?LTR x 10 ?Supine PPT x 10 - 5" hold  ?Supine PPT w/ march x 20 BTB ?Supine clamshell 2x20 BTB ?Abdominal curl up 2x10 ?90/90 hold 3x15" ?Bridge 2x10 - 3" hold ?Supine SLR 3x5 each ?Supine DKTC on physioball x 10 - 5" hold ? ?[facility] Adult PT Treatment:  DATE: 02/08/2022 ?Therapeutic Exercise: ?NuStep lvl 6 x 3 min while taking subjective ?LTR x 10 ?Supine PPT x 10 - 5" hold  ?Supine PPT w/ march x 20 BTB ?Supine clamshell x 20 BTB ?Abdominal curl up 2x10 ?Bridge 2x10 - 3" hold ?Supine SLR 3x5 each ?Supine DKTC on physioball x 10 - 5" hold ? ?[facility] Adult PT Treatment:  DATE: 02/06/2022 ?Therapeutic Exercise: ?NuStep lvl 6 x 3 min while taking subjective ?LTR x 10 ?Supine PPT x 10 - 5" hold  ?Supine PPT w/ march x 20 BTB ?Supine clamshell x 20 BTB ?Abdominal curl up 2x10 ?Bridge 2x10 - 3" hold ?Supine SLR 3x5 each ?Supine DKTC on physioball  x 10 - 5" hold ? ?PATIENT EDUCATION:  ?Education details: eval findings, FOTO, HEP, POC ?Person educated: Patient ?Education method: Explanation, Demonstration, and Handouts ?Education comprehension: verbalized understanding and returned demonstration ?  ?  ?HOME EXERCISE PROGRAM: ?Access Code: [email or web address] ?URL: [email or web address] ?Date: 02/01/2022 ?Prepared by: Octavio Manns ? ?Exercises ?Supine Sciatic Nerve Glide - 1-2 x daily - 7 x weekly - 2 sets - 10 reps ?Supine Lower Trunk Rotation - 1-2 x daily - 7 x weekly - 2 sets - 10 reps ?Supine Piriformis Stretch with Leg Straight - 1-2 x daily - 7 x weekly - 2 reps - 30 sec hold ?Hooklying Clamshell with Resistance - 1-2 x daily - 7 x weekly - 3 sets - 10 reps ?Supine Posterior Pelvic Tilt - 1-2 x daily - 7 x weekly - 2 sets - 10 reps - 5 sec hold ?Active Straight Leg Raise  with Quad Set - 1 x daily - 7 x weekly - 2 sets - 10 reps ?  ?  ?ASSESSMENT: ?  ?CLINICAL IMPRESSION: ?*** ?  ?  ?OBJECTIVE IMPAIRMENTS decreased balance, decreased endurance, decreased mobility, difficulty walking, decreased ROM, decreased strength, and pain.  ?  ?ACTIVITY LIMITATIONS community activity, occupation, and yard work.  ?  ?PERSONAL FACTORS Fitness, Time since onset of injury/illness/exacerbation, and 1-2 comorbidities: HTN, Graves disease   are also affecting patient's functional outcome.  ?  ?  ?GOALS: ?Goals reviewed with patient? No ?  ?SHORT TERM GOALS: ?  ?STG Name Target Date Goal status  ?1 Pt will be compliant and knowledgeable with initial HEP for improved comfort and carryover ?Baseline: initial HEP given 02/08/2022 MET  ?2 Pt will self report lower back and R LE pain no greater than 6/10 for improved comfort and functional ability ?Baseline: 10/10 at worst 02/08/2022 MET  ?  ?LONG TERM GOALS:  ?  ?LTG Name Target Date Goal status  ?1 Pt will self report lower back and R LE pain no greater than 2/10 for improved comfort and functional ability ?Baseline: 10/10 at worst 03/15/2022 INITIAL  ?2 Pt will improve FOTO function score to no less than 63% as proxy for functional improvement ?Baseline: 47% function 03/15/2022 INITIAL  ?3 Pt will increase 30 Second Sit to Stand rep count to no less than 10 reps for improved balance, strength, and functional mobility ?Baseline: 8 reps  03/15/2022 INITIAL  ?4 Pt will improve bilateral hip abductor strength to no less than 4/5 for improved functional mobility and decreased pain ?Baseline: see chart 03/15/2022 INITIAL  ?5 Pt will be able to stand for >45 min without increase in LBP for improved functional ability with work and ADLs ?Baseline: unable 03/15/2022 INITIAL  ?  ?PLAN: ?PT FREQUENCY: 2x/week ?  ?PT DURATION: 8 weeks ?  ?PLANNED INTERVENTIONS: Therapeutic exercises, Therapeutic activity, Neuromuscular re-education, Balance training, Gait training,  Patient/Family education, Joint mobilization, Aquatic Therapy, Dry Needling, Electrical stimulation, Cryotherapy, Moist heat, and Manual therapy ?  ?PLAN FOR NEXT SESSION: assess HEP response,  ? ? ? ?Ward Chatters, PT ?02/15/2022, 9:00 AM ? ?   ?

## 2022-02-20 ENCOUNTER — Ambulatory Visit: Payer: 59

## 2022-02-20 NOTE — Therapy (Incomplete)
?OUTPATIENT PHYSICAL THERAPY TREATMENT NOTE ? ? ?Patient Name: Alexandra Benjamin ?MRN: [id number] ?DOB:05/05/1976, 46 y.o., female ?Today's Date: 02/20/2022 ? ?PCP: Donney Dice, DO ?REFERRING PROVIDER: Donney Dice, DO ? ? ? ? ? ? ? ? ?Past Medical History:  ?Diagnosis Date  ? Anxiety   ? Exposure to sexually transmitted disease (STD) 09/25/2019  ? Hypertension   ? Thyroid disease   ? ?Past Surgical History:  ?Procedure Laterality Date  ? FINGER FRACTURE SURGERY    ? TUBAL LIGATION    ? ?Patient Active Problem List  ? Diagnosis Date Noted  ? Mood changes 07/11/2021  ? Muscle strain 06/06/2021  ? Pre-diabetes 10/08/2020  ? Chest pain at rest 09/07/2020  ? HTN (hypertension), benign 03/12/2011  ? Obesity 03/12/2011  ? History of Graves' disease 02/12/2007  ? ? ?REFERRING DIAG:  ?M54.41 (ICD-10-CM) - Right-sided low back pain with right-sided sciatica, unspecified chronicity ? ?THERAPY DIAG:  ?No diagnosis found. ? ?PERTINENT HISTORY:  ?None ? ?PRECAUTIONS:  ?None ? ?SUBJECTIVE:  ?*** ? ?PAIN:  ?Are you having pain? Yes ?NPRS scale: 6/10 ?Pain location: lower back ?Pain location: lower back, R LE ?PAIN TYPE: aching and sharp ?Pain description: intermittent  ?Aggravating factors: prolonged standing ?Relieving factors: heat,  ? ? ? ?OBJECTIVE:  ?    ?PATIENT SURVEYS:  ?FOTO 47% function; 63% predicted ?  ?LE MMT: ?  ?MMT Right ?01/18/2022 Left ?01/18/2022  ?Hip flexion  4/5 4/5  ?Hip extension      ?Hip abduction 3+/5 3+/5  ?Hip adduction      ?Hip external rotation      ?Hip internal rotation      ?Knee extension 5/5 5/5  ?Knee flexion 5/5 5/5  ?Ankle dorsiflexion       ?Ankle plantarflexion      ?Ankle inversion      ?Ankle eversion      ?Grossly      ?(Blank rows = not tested) ?  ? ?FUNCTIONAL TESTS:  ?30 seconds chair stand test: 8 reps ?  ?TODAY'S TREATMENT  ?[facility] Adult PT Treatment:  DATE: 02/20/2022 ?Therapeutic Exercise: ?Physioball rollout x 15 fwd ?LTR x 10 ?Supine PPT x 10 - 5" hold  ?Supine PPT w/ march x 20 BTB ?Supine  clamshell 2x20 BTB ?Abdominal curl up 2x10 ?90/90 hold 3x15" ?Bridge 2x10 - 3" hold ?Supine SLR 3x5 each ?Supine DKTC on physioball x 10 - 5" hold ? ?[facility] Adult PT Treatment:  DATE: 02/13/2022 ?Therapeutic Exercise: ?Physioball rollout x 15 fwd ?LTR x 10 ?Supine PPT x 10 - 5" hold  ?Supine PPT w/ march x 20 BTB ?Supine clamshell 2x20 BTB ?Abdominal curl up 2x10 ?90/90 hold 3x15" ?Bridge 2x10 - 3" hold ?Supine SLR 3x5 each ?Supine DKTC on physioball x 10 - 5" hold ? ?[facility] Adult PT Treatment:  DATE: 02/08/2022 ?Therapeutic Exercise: ?NuStep lvl 6 x 3 min while taking subjective ?LTR x 10 ?Supine PPT x 10 - 5" hold  ?Supine PPT w/ march x 20 BTB ?Supine clamshell x 20 BTB ?Abdominal curl up 2x10 ?Bridge 2x10 - 3" hold ?Supine SLR 3x5 each ?Supine DKTC on physioball x 10 - 5" hold ? ?[facility] Adult PT Treatment:  DATE: 02/06/2022 ?Therapeutic Exercise: ?NuStep lvl 6 x 3 min while taking subjective ?LTR x 10 ?Supine PPT x 10 - 5" hold  ?Supine PPT w/ march x 20 BTB ?Supine clamshell x 20 BTB ?Abdominal curl up 2x10 ?Bridge 2x10 - 3" hold ?Supine SLR 3x5 each ?Supine DKTC on physioball x  10 - 5" hold ? ?PATIENT EDUCATION:  ?Education details: eval findings, FOTO, HEP, POC ?Person educated: Patient ?Education method: Explanation, Demonstration, and Handouts ?Education comprehension: verbalized understanding and returned demonstration ?  ?  ?HOME EXERCISE PROGRAM: ?Access Code: [email or web address] ?URL: [email or web address] ?Date: 02/01/2022 ?Prepared by: Octavio Manns ? ?Exercises ?Supine Sciatic Nerve Glide - 1-2 x daily - 7 x weekly - 2 sets - 10 reps ?Supine Lower Trunk Rotation - 1-2 x daily - 7 x weekly - 2 sets - 10 reps ?Supine Piriformis Stretch with Leg Straight - 1-2 x daily - 7 x weekly - 2 reps - 30 sec hold ?Hooklying Clamshell with Resistance - 1-2 x daily - 7 x weekly - 3 sets - 10 reps ?Supine Posterior Pelvic Tilt - 1-2 x daily - 7 x weekly - 2 sets - 10 reps - 5 sec hold ?Active Straight Leg Raise with Quad  Set - 1 x daily - 7 x weekly - 2 sets - 10 reps ?  ?  ?ASSESSMENT: ?  ?CLINICAL IMPRESSION: ?*** ?  ?  ?OBJECTIVE IMPAIRMENTS decreased balance, decreased endurance, decreased mobility, difficulty walking, decreased ROM, decreased strength, and pain.  ?  ?ACTIVITY LIMITATIONS community activity, occupation, and yard work.  ?  ?PERSONAL FACTORS Fitness, Time since onset of injury/illness/exacerbation, and 1-2 comorbidities: HTN, Graves disease   are also affecting patient's functional outcome.  ?  ?  ?GOALS: ?Goals reviewed with patient? No ?  ?SHORT TERM GOALS: ?  ?STG Name Target Date Goal status  ?1 Pt will be compliant and knowledgeable with initial HEP for improved comfort and carryover ?Baseline: initial HEP given 02/08/2022 MET  ?2 Pt will self report lower back and R LE pain no greater than 6/10 for improved comfort and functional ability ?Baseline: 10/10 at worst 02/08/2022 MET  ?  ?LONG TERM GOALS:  ?  ?LTG Name Target Date Goal status  ?1 Pt will self report lower back and R LE pain no greater than 2/10 for improved comfort and functional ability ?Baseline: 10/10 at worst 03/15/2022 INITIAL  ?2 Pt will improve FOTO function score to no less than 63% as proxy for functional improvement ?Baseline: 47% function 03/15/2022 INITIAL  ?3 Pt will increase 30 Second Sit to Stand rep count to no less than 10 reps for improved balance, strength, and functional mobility ?Baseline: 8 reps  03/15/2022 INITIAL  ?4 Pt will improve bilateral hip abductor strength to no less than 4/5 for improved functional mobility and decreased pain ?Baseline: see chart 03/15/2022 INITIAL  ?5 Pt will be able to stand for >45 min without increase in LBP for improved functional ability with work and ADLs ?Baseline: unable 03/15/2022 INITIAL  ?  ?PLAN: ?PT FREQUENCY: 2x/week ?  ?PT DURATION: 8 weeks ?  ?PLANNED INTERVENTIONS: Therapeutic exercises, Therapeutic activity, Neuromuscular re-education, Balance training, Gait training, Patient/Family  education, Joint mobilization, Aquatic Therapy, Dry Needling, Electrical stimulation, Cryotherapy, Moist heat, and Manual therapy ?  ?PLAN FOR NEXT SESSION: assess HEP response,  ? ? ? ?Ward Chatters, PT ?02/20/2022, 9:17 AM ? ?   ?

## 2022-02-22 ENCOUNTER — Ambulatory Visit: Payer: 59 | Attending: Family Medicine

## 2022-02-22 DIAGNOSIS — R2689 Other abnormalities of gait and mobility: Secondary | ICD-10-CM | POA: Diagnosis not present

## 2022-02-22 DIAGNOSIS — M545 Low back pain, unspecified: Secondary | ICD-10-CM | POA: Insufficient documentation

## 2022-02-22 DIAGNOSIS — G8929 Other chronic pain: Secondary | ICD-10-CM | POA: Diagnosis not present

## 2022-02-22 DIAGNOSIS — M6281 Muscle weakness (generalized): Secondary | ICD-10-CM | POA: Diagnosis not present

## 2022-02-22 NOTE — Therapy (Signed)
?OUTPATIENT PHYSICAL THERAPY TREATMENT NOTE ? ? ?Patient Name: Alexandra Benjamin ?MRN: [id number] ?DOB:1975/12/15, 46 y.o., female ?Today's Date: 02/22/2022 ? ?PCP: Donney Dice, DO ?REFERRING PROVIDER: Zenia Resides, MD ? ? PT End of Session - 02/22/22 1531   ? ? Visit Number 7   ? Number of Visits 17   ? Date for PT Re-Evaluation 03/14/22   ? Authorization Type [facility]   ? PT Start Time 1530   ? PT Stop Time 1615   ? PT Time Calculation (min) 45 min   ? Activity Tolerance Patient tolerated treatment well   ? Behavior During Therapy [facility] for tasks assessed/performed   ? ?  ?  ? ?  ? ? ? ? ? ? ? ?Past Medical History:  ?Diagnosis Date  ? Anxiety   ? Exposure to sexually transmitted disease (STD) 09/25/2019  ? Hypertension   ? Thyroid disease   ? ?Past Surgical History:  ?Procedure Laterality Date  ? FINGER FRACTURE SURGERY    ? TUBAL LIGATION    ? ?Patient Active Problem List  ? Diagnosis Date Noted  ? Mood changes 07/11/2021  ? Muscle strain 06/06/2021  ? Pre-diabetes 10/08/2020  ? Chest pain at rest 09/07/2020  ? HTN (hypertension), benign 03/12/2011  ? Obesity 03/12/2011  ? History of Graves' disease 02/12/2007  ? ? ?REFERRING DIAG:  ?M54.41 (ICD-10-CM) - Right-sided low back pain with right-sided sciatica, unspecified chronicity ? ?THERAPY DIAG:  ?Chronic low back pain, unspecified back pain laterality, unspecified whether sciatica present ? ?Muscle weakness (generalized) ? ?Other abnormalities of gait and mobility ? ?PERTINENT HISTORY:  ?None ? ?PRECAUTIONS:  ?None ? ?SUBJECTIVE: Pt presents to PT with continued reports of lower back pain and discomfort. She is very tired today from working. She reports taking a muscle relaxer this morning before work.   ? ?PAIN:  ?Are you having pain? Yes ?NPRS scale: 6/10 ?Pain location: lower back ?Pain location: lower back, R LE ?PAIN TYPE: aching and sharp ?Pain description: intermittent  ?Aggravating factors: prolonged standing ?Relieving factors: heat,  ? ? ? ?OBJECTIVE:   ?    ?PATIENT SURVEYS:  ?FOTO 47% function; 63% predicted ?  ?LE MMT: ?  ?MMT Right ?01/18/2022 Left ?01/18/2022 Right ?02/22/2022 Left ?02/22/2022  ?Hip flexion  4/5 4/5 4/5 4+/5  ?Hip extension        ?Hip abduction 3+/5 3+/5 3+/5 4/5  ?Hip adduction        ?Hip external rotation        ?Hip internal rotation        ?Knee extension 5/5 5/5    ?Knee flexion 5/5 5/5    ?Ankle dorsiflexion         ?Ankle plantarflexion        ?Ankle inversion        ?Ankle eversion        ?Grossly        ?(Blank rows = not tested) ?  ? ?FUNCTIONAL TESTS:  ?30 seconds chair stand test: 8 reps ?  ?TODAY'S TREATMENT  ?[facility] Adult PT Treatment:                                                DATE: 02/22/2022 ?Therapeutic Exercise: ?NuStep lvl 6 x 3 min while taking subjective ?Physioball rollout x 10 fwd x10 lat ?Seated pball PPT x15 ?LTR x  10 ?Supine PPT w/ march x 20 BTB ?Supine clamshell 2x20 BTB ?Supine 90/90 alternating heel taps 2x10 ?Bridge with hip adduction ball squeeze 2x10 - 3" hold ?Supine SLR 2x5 each ? ? ?[place] Adult PT Treatment:  DATE: 02/13/2022 ?Therapeutic Exercise: ?Physioball rollout x 15 fwd ?LTR x 10 ?Supine PPT x 10 - 5" hold  ?Supine PPT w/ march x 20 BTB ?Supine clamshell 2x20 BTB ?Abdominal curl up 2x10 ?90/90 hold 3x15" ?Bridge 2x10 - 3" hold ?Supine SLR 3x5 each ?Supine DKTC on physioball x 10 - 5" hold ? ?[facility] Adult PT Treatment:  DATE: 02/08/2022 ?Therapeutic Exercise: ?NuStep lvl 6 x 3 min while taking subjective ?LTR x 10 ?Supine PPT x 10 - 5" hold  ?Supine PPT w/ march x 20 BTB ?Supine clamshell x 20 BTB ?Abdominal curl up 2x10 ?Bridge 2x10 - 3" hold ?Supine SLR 3x5 each ?Supine DKTC on physioball x 10 - 5" hold ? ? ?PATIENT EDUCATION:  ?Education details: eval findings, FOTO, HEP, POC ?Person educated: Patient ?Education method: Explanation, Demonstration, and Handouts ?Education comprehension: verbalized understanding and returned demonstration ?  ?  ?HOME EXERCISE PROGRAM: ?Access Code: [email or web address] ?URL:  [email or web address] ?Date: 02/01/2022 ?Prepared by: Octavio Manns ? ?Exercises ?Supine Sciatic Nerve Glide - 1-2 x daily - 7 x weekly - 2 sets - 10 reps ?Supine Lower Trunk Rotation - 1-2 x daily - 7 x weekly - 2 sets - 10 reps ?Supine Piriformis Stretch with Leg Straight - 1-2 x daily - 7 x weekly - 2 reps - 30 sec hold ?Hooklying Clamshell with Resistance - 1-2 x daily - 7 x weekly - 3 sets - 10 reps ?Supine Posterior Pelvic Tilt - 1-2 x daily - 7 x weekly - 2 sets - 10 reps - 5 sec hold ?Active Straight Leg Raise with Quad Set - 1 x daily - 7 x weekly - 2 sets - 10 reps ?  ?  ?ASSESSMENT: ?  ?CLINICAL IMPRESSION: ?Patient presents to PT with moderate levels of pain in her lower back and reports increased tiredness from working. Session today focused on improving proximal hip and core strength. Patient was able to tolerate all prescribed exercises with no adverse effects. Her overall LE strength has improved, though her R leg is weaker than her L subjectively and objectively. Patient continues to benefit from skilled PT services and should be progressed as able to improve functional independence. ? ?  ?OBJECTIVE IMPAIRMENTS decreased balance, decreased endurance, decreased mobility, difficulty walking, decreased ROM, decreased strength, and pain.  ?  ?ACTIVITY LIMITATIONS community activity, occupation, and yard work.  ?  ?PERSONAL FACTORS Fitness, Time since onset of injury/illness/exacerbation, and 1-2 comorbidities: HTN, Graves disease   are also affecting patient's functional outcome.  ?  ?  ?GOALS: ?Goals reviewed with patient? No ?  ?SHORT TERM GOALS: ?  ?STG Name Target Date Goal status  ?1 Pt will be compliant and knowledgeable with initial HEP for improved comfort and carryover ?Baseline: initial HEP given 02/08/2022 MET  ?2 Pt will self report lower back and R LE pain no greater than 6/10 for improved comfort and functional ability ?Baseline: 10/10 at worst 02/08/2022 MET  ?  ?LONG TERM  GOALS:  ?  ?LTG Name Target Date Goal status  ?1 Pt will self report lower back and R LE pain no greater than 2/10 for improved comfort and functional ability ?Baseline: 10/10 at worst 03/15/2022 INITIAL  ?2 Pt will improve FOTO function score to no less than 63% as proxy  for functional improvement ?Baseline: 47% function 03/15/2022 INITIAL  ?3 Pt will increase 30 Second Sit to Stand rep count to no less than 10 reps for improved balance, strength, and functional mobility ?Baseline: 8 reps  03/15/2022 INITIAL  ?4 Pt will improve bilateral hip abductor strength to no less than 4/5 for improved functional mobility and decreased pain ?Baseline: see chart 03/15/2022 INITIAL  ?5 Pt will be able to stand for >45 min without increase in LBP for improved functional ability with work and ADLs ?Baseline: unable 03/15/2022 INITIAL  ?  ?PLAN: ?PT FREQUENCY: 2x/week ?  ?PT DURATION: 8 weeks ?  ?PLANNED INTERVENTIONS: Therapeutic exercises, Therapeutic activity, Neuromuscular re-education, Balance training, Gait training, Patient/Family education, Joint mobilization, Aquatic Therapy, Dry Needling, Electrical stimulation, Cryotherapy, Moist heat, and Manual therapy ?  ?PLAN FOR NEXT SESSION: assess HEP response,  ? ? ? ?Evelene Croon, PTA ?02/22/2022, 4:12 PM ? ?   ?

## 2022-02-27 ENCOUNTER — Ambulatory Visit: Payer: 59

## 2022-02-27 DIAGNOSIS — M6281 Muscle weakness (generalized): Secondary | ICD-10-CM | POA: Diagnosis not present

## 2022-02-27 DIAGNOSIS — G8929 Other chronic pain: Secondary | ICD-10-CM

## 2022-02-27 DIAGNOSIS — R2689 Other abnormalities of gait and mobility: Secondary | ICD-10-CM | POA: Diagnosis not present

## 2022-02-27 DIAGNOSIS — M545 Low back pain, unspecified: Secondary | ICD-10-CM | POA: Diagnosis not present

## 2022-02-27 NOTE — Therapy (Signed)
?OUTPATIENT PHYSICAL THERAPY TREATMENT NOTE ? ? ?Patient Name: Alexandra Benjamin ?MRN: [id number] ?DOB:[birth year], 46 y.o., female ?Today's Date: 02/27/2022 ? ?PCP: Donney Dice, DO ?REFERRING PROVIDER: Donney Dice, DO ? ? PT End of Session - 02/27/22 1214   ? ? Visit Number 8   ? Number of Visits 17   ? Date for PT Re-Evaluation 03/14/22   ? Authorization Type [facility]   ? PT Start Time 1215   ? PT Stop Time 0947   ? PT Time Calculation (min) 41 min   ? Activity Tolerance Patient tolerated treatment well   ? Behavior During Therapy [facility] for tasks assessed/performed   ? ?  ?  ? ?  ? ? ? ? ? ? ? ? ?Past Medical History:  ?Diagnosis Date  ? Anxiety   ? Exposure to sexually transmitted disease (STD) 09/25/2019  ? Hypertension   ? Thyroid disease   ? ?Past Surgical History:  ?Procedure Laterality Date  ? FINGER FRACTURE SURGERY    ? TUBAL LIGATION    ? ?Patient Active Problem List  ? Diagnosis Date Noted  ? Mood changes 07/11/2021  ? Muscle strain 06/06/2021  ? Pre-diabetes 10/08/2020  ? Chest pain at rest 09/07/2020  ? HTN (hypertension), benign 03/12/2011  ? Obesity 03/12/2011  ? History of Graves' disease 02/12/2007  ? ? ?REFERRING DIAG:  ?M54.41 (ICD-10-CM) - Right-sided low back pain with right-sided sciatica, unspecified chronicity ? ?THERAPY DIAG:  ?Chronic low back pain, unspecified back pain laterality, unspecified whether sciatica present ? ?Muscle weakness (generalized) ? ?Other abnormalities of gait and mobility ? ?PERTINENT HISTORY:  ?None ? ?PRECAUTIONS:  ?None ? ?SUBJECTIVE:  ?Pt presents to PT with continued reports of back and R LE pain. Pt has been compliant with HEP with no adverse effect. Pt is ready to begin PT at this time.  ? ?PAIN:  ?Are you having pain? Yes ?NPRS scale: 3/10 ?Pain location: lower back ?Pain location: lower back, R LE ?PAIN TYPE: aching and sharp ?Pain description: intermittent  ?Aggravating factors: prolonged standing ?Relieving factors: heat,  ? ? ? ?OBJECTIVE:  ?    ?PATIENT  SURVEYS:  ?FOTO 47% function; 63% predicted ?  ?LE MMT: ?  ?MMT Right ?01/18/2022 Left ?01/18/2022 Right ?02/22/2022 Left ?02/22/2022  ?Hip flexion  4/5 4/5 4/5 4+/5  ?Hip extension        ?Hip abduction 3+/5 3+/5 3+/5 4/5  ?Hip adduction        ?Hip external rotation        ?Hip internal rotation        ?Knee extension 5/5 5/5    ?Knee flexion 5/5 5/5    ?Ankle dorsiflexion         ?Ankle plantarflexion        ?Ankle inversion        ?Ankle eversion        ?Grossly        ?(Blank rows = not tested) ?  ? ?FUNCTIONAL TESTS:  ?30 seconds chair stand test: 8 reps ?  ?TODAY'S TREATMENT  ?[facility] Adult PT Treatment:                                                DATE: 02/27/2022 ?Therapeutic Exercise: ?NuStep lvl 6 x 4 min while taking subjective ?Bridge 2x10 - 3" hold ?Physioball rollout fwd/lat x 10 each ?  LTR x 10 ?Supine 90/90 hold 3x20" ?Supine 90/90 alt toe taps 3x10 ?Supine piriformis stretch 2x30" R ?Supine clamshell 2x20 BTB ?Supine SLR 3x5 each ?STS 2x10 - no UE support ? ?[facility] Adult PT Treatment:                                                DATE: 02/22/2022 ?Therapeutic Exercise: ?NuStep lvl 6 x 3 min while taking subjective ?Physioball rollout x 10 fwd x10 lat ?Seated pball PPT x15 ?LTR x 10 ?Supine PPT w/ march x 20 BTB ?Supine clamshell 2x20 BTB ?Supine 90/90 alternating heel taps 2x10 ?Bridge with hip adduction ball squeeze 2x10 - 3" hold ?Supine SLR 2x5 each ? ?[facility] Adult PT Treatment:  DATE: 02/13/2022 ?Therapeutic Exercise: ?Physioball rollout x 15 fwd ?LTR x 10 ?Supine PPT x 10 - 5" hold  ?Supine PPT w/ march x 20 BTB ?Supine clamshell 2x20 BTB ?Abdominal curl up 2x10 ?90/90 hold 3x15" ?Bridge 2x10 - 3" hold ?Supine SLR 3x5 each ?Supine DKTC on physioball x 10 - 5" hold ? ?PATIENT EDUCATION:  ?Education details: eval findings, FOTO, HEP, POC ?Person educated: Patient ?Education method: Explanation, Demonstration, and Handouts ?Education comprehension: verbalized understanding and returned demonstration ?  ?  ?HOME  EXERCISE PROGRAM: ?Access Code: [email or web address] ?URL: [email or web address] ?Date: 02/01/2022 ?Prepared by: Octavio Manns ? ?Exercises ?Supine Sciatic Nerve Glide - 1-2 x daily - 7 x weekly - 2 sets - 10 reps ?Supine Lower Trunk Rotation - 1-2 x daily - 7 x weekly - 2 sets - 10 reps ?Supine Piriformis Stretch with Leg Straight - 1-2 x daily - 7 x weekly - 2 reps - 30 sec hold ?Hooklying Clamshell with Resistance - 1-2 x daily - 7 x weekly - 3 sets - 10 reps ?Supine Posterior Pelvic Tilt - 1-2 x daily - 7 x weekly - 2 sets - 10 reps - 5 sec hold ?Active Straight Leg Raise with Quad Set - 1 x daily - 7 x weekly - 2 sets - 10 reps ?  ?  ?ASSESSMENT: ?  ?CLINICAL IMPRESSION: ?Pt was able to complete all prescribed exercises with no adverse effect. Therapy today continued to focus on improving core and proximal hip strength in order to decrease pain and improve function with work and home ADLs. Pt continues to progress well with therapy, showing improving strength and activity tolerance. PT will continue to progress as tolerated per POC.  ? ?  ?OBJECTIVE IMPAIRMENTS decreased balance, decreased endurance, decreased mobility, difficulty walking, decreased ROM, decreased strength, and pain.  ?  ?ACTIVITY LIMITATIONS community activity, occupation, and yard work.  ?  ?PERSONAL FACTORS Fitness, Time since onset of injury/illness/exacerbation, and 1-2 comorbidities: HTN, Graves disease   are also affecting patient's functional outcome.  ?  ?  ?GOALS: ?Goals reviewed with patient? No ?  ?SHORT TERM GOALS: ?  ?STG Name Target Date Goal status  ?1 Pt will be compliant and knowledgeable with initial HEP for improved comfort and carryover ?Baseline: initial HEP given 02/08/2022 MET  ?2 Pt will self report lower back and R LE pain no greater than 6/10 for improved comfort and functional ability ?Baseline: 10/10 at worst 02/08/2022 MET  ?  ?LONG TERM GOALS:  ?  ?LTG Name Target Date Goal status  ?1 Pt will self report lower back  and R LE pain no greater than 2/10  for improved comfort and functional ability ?Baseline: 10/10 at worst 03/15/2022 INITIAL  ?2 Pt will improve FOTO function score to no less than 63% as proxy for functional improvement ?Baseline: 47% function 03/15/2022 INITIAL  ?3 Pt will increase 30 Second Sit to Stand rep count to no less than 10 reps for improved balance, strength, and functional mobility ?Baseline: 8 reps  03/15/2022 INITIAL  ?4 Pt will improve bilateral hip abductor strength to no less than 4/5 for improved functional mobility and decreased pain ?Baseline: see chart 03/15/2022 INITIAL  ?5 Pt will be able to stand for >45 min without increase in LBP for improved functional ability with work and ADLs ?Baseline: unable 03/15/2022 INITIAL  ?  ?PLAN: ?PT FREQUENCY: 2x/week ?  ?PT DURATION: 8 weeks ?  ?PLANNED INTERVENTIONS: Therapeutic exercises, Therapeutic activity, Neuromuscular re-education, Balance training, Gait training, Patient/Family education, Joint mobilization, Aquatic Therapy, Dry Needling, Electrical stimulation, Cryotherapy, Moist heat, and Manual therapy ?  ?PLAN FOR NEXT SESSION: assess HEP response,  ? ? ? ?Ward Chatters, PT ?02/27/2022, 1:00 PM ? ?   ?

## 2022-03-01 ENCOUNTER — Ambulatory Visit: Payer: 59

## 2022-03-01 DIAGNOSIS — M545 Low back pain, unspecified: Secondary | ICD-10-CM | POA: Diagnosis not present

## 2022-03-01 DIAGNOSIS — G8929 Other chronic pain: Secondary | ICD-10-CM | POA: Diagnosis not present

## 2022-03-01 DIAGNOSIS — R2689 Other abnormalities of gait and mobility: Secondary | ICD-10-CM | POA: Diagnosis not present

## 2022-03-01 DIAGNOSIS — M6281 Muscle weakness (generalized): Secondary | ICD-10-CM

## 2022-03-01 NOTE — Therapy (Signed)
?OUTPATIENT PHYSICAL THERAPY TREATMENT NOTE ? ? ?Patient Name: Alexandra Benjamin ?MRN: [id number] ?DOB:23-Jan-1976, 46 y.o., female ?Today's Date: 03/01/2022 ? ?PCP: Donney Dice, DO ?REFERRING PROVIDER: Zenia Resides, MD ? ? PT End of Session - 03/01/22 1711   ? ? Visit Number 9   ? Number of Visits 17   ? Date for PT Re-Evaluation 03/14/22   ? Authorization Type [facility]   ? PT Start Time 6010   arrived late  ? PT Stop Time 9323   ? PT Time Calculation (min) 38 min   ? Activity Tolerance Patient tolerated treatment well   ? Behavior During Therapy [facility] for tasks assessed/performed   ? ?  ?  ? ?  ? ? ? ? ? ? ? ? ? ?Past Medical History:  ?Diagnosis Date  ? Anxiety   ? Exposure to sexually transmitted disease (STD) 09/25/2019  ? Hypertension   ? Thyroid disease   ? ?Past Surgical History:  ?Procedure Laterality Date  ? FINGER FRACTURE SURGERY    ? TUBAL LIGATION    ? ?Patient Active Problem List  ? Diagnosis Date Noted  ? Mood changes 07/11/2021  ? Muscle strain 06/06/2021  ? Pre-diabetes 10/08/2020  ? Chest pain at rest 09/07/2020  ? HTN (hypertension), benign 03/12/2011  ? Obesity 03/12/2011  ? History of Graves' disease 02/12/2007  ? ? ?REFERRING DIAG:  ?M54.41 (ICD-10-CM) - Right-sided low back pain with right-sided sciatica, unspecified chronicity ? ?THERAPY DIAG:  ?Chronic low back pain, unspecified back pain laterality, unspecified whether sciatica present ? ?Muscle weakness (generalized) ? ?PERTINENT HISTORY:  ?None ? ?PRECAUTIONS:  ?None ? ?SUBJECTIVE:  ?Pt presents to PT with reports of continued decrease LBP. Pt has been compliant with HEP with no adverse effect. Pt is ready to begin PT at this time. ? ?PAIN:  ?Are you having pain? Yes ?NPRS scale: 1/10 ?Pain location: lower back ?Pain location: lower back, R LE ?PAIN TYPE: aching and sharp ?Pain description: intermittent  ?Aggravating factors: prolonged standing ?Relieving factors: heat,  ? ? ? ?OBJECTIVE:  ?    ?PATIENT SURVEYS:  ?FOTO 47% function; 63%  predicted ?  ?LE MMT: ?  ?MMT Right ?01/18/2022 Left ?01/18/2022 Right ?02/22/2022 Left ?02/22/2022  ?Hip flexion  4/5 4/5 4/5 4+/5  ?Hip extension        ?Hip abduction 3+/5 3+/5 3+/5 4/5  ?Hip adduction        ?Hip external rotation        ?Hip internal rotation        ?Knee extension 5/5 5/5    ?Knee flexion 5/5 5/5    ?Ankle dorsiflexion         ?Ankle plantarflexion        ?Ankle inversion        ?Ankle eversion        ?Grossly        ?(Blank rows = not tested) ?  ?FUNCTIONAL TESTS:  ?30 seconds chair stand test: 8 reps ?  ?TODAY'S TREATMENT  ?[facility] Adult PT Treatment:                                                DATE: 03/01/2022 ?Therapeutic Exercise: ?STS with 5# KB 2x10 ?Bridge 2x10 - 3" hold ?Physioball rollout fwd/lat x 10 each ?LTR x 10 ?Supine 90/90 with UE flex/ext 5# 3x5 ?Supine  90/90 alt toe taps 3x10 ?Supine clamshell 2x15 Black TB ?Supine SLR 3x5 each ?Standing hip ext 2x10 25# ?PPT on physioball 2x10  ? ?[facility] Adult PT Treatment:                                                DATE: 02/27/2022 ?Therapeutic Exercise: ?NuStep lvl 6 x 4 min while taking subjective ?Bridge 2x10 - 3" hold ?Physioball rollout fwd/lat x 10 each ?LTR x 10 ?Supine 90/90 hold 3x20" ?Supine 90/90 alt toe taps 3x10 ?Supine piriformis stretch 2x30" R ?Supine clamshell 2x20 BTB ?Supine SLR 3x5 each ?STS 2x10 - no UE support ? ?[facility] Adult PT Treatment:                                                DATE: 02/22/2022 ?Therapeutic Exercise: ?NuStep lvl 6 x 3 min while taking subjective ?Physioball rollout x 10 fwd x10 lat ?Seated pball PPT x15 ?LTR x 10 ?Supine PPT w/ march x 20 BTB ?Supine clamshell 2x20 BTB ?Supine 90/90 alternating heel taps 2x10 ?Bridge with hip adduction ball squeeze 2x10 - 3" hold ?Supine SLR 2x5 each ? ?PATIENT EDUCATION:  ?Education details: eval findings, FOTO, HEP, POC ?Person educated: Patient ?Education method: Explanation, Demonstration, and Handouts ?Education comprehension: verbalized understanding and returned  demonstration ?  ?  ?HOME EXERCISE PROGRAM: ?Access Code: [email or web address] ?URL: [email or web address] ?Date: 02/01/2022 ?Prepared by: Octavio Manns ? ?Exercises ?Supine Sciatic Nerve Glide - 1-2 x daily - 7 x weekly - 2 sets - 10 reps ?Supine Lower Trunk Rotation - 1-2 x daily - 7 x weekly - 2 sets - 10 reps ?Supine Piriformis Stretch with Leg Straight - 1-2 x daily - 7 x weekly - 2 reps - 30 sec hold ?Hooklying Clamshell with Resistance - 1-2 x daily - 7 x weekly - 3 sets - 10 reps ?Supine Posterior Pelvic Tilt - 1-2 x daily - 7 x weekly - 2 sets - 10 reps - 5 sec hold ?Active Straight Leg Raise with Quad Set - 1 x daily - 7 x weekly - 2 sets - 10 reps ?  ?  ?ASSESSMENT: ?  ?CLINICAL IMPRESSION: ?Pt was able to complete all prescribed exercises with no adverse effect. Therapy today continued to focus on improving core and proximal hip strength in order to decrease pain and improve mobility. PT will continue to progress exercises as able per POC.   ? ?  ?OBJECTIVE IMPAIRMENTS decreased balance, decreased endurance, decreased mobility, difficulty walking, decreased ROM, decreased strength, and pain.  ?  ?ACTIVITY LIMITATIONS community activity, occupation, and yard work.  ?  ?PERSONAL FACTORS Fitness, Time since onset of injury/illness/exacerbation, and 1-2 comorbidities: HTN, Graves disease   are also affecting patient's functional outcome.  ?  ?  ?GOALS: ?Goals reviewed with patient? No ?  ?SHORT TERM GOALS: ?  ?STG Name Target Date Goal status  ?1 Pt will be compliant and knowledgeable with initial HEP for improved comfort and carryover ?Baseline: initial HEP given 02/08/2022 MET  ?2 Pt will self report lower back and R LE pain no greater than 6/10 for improved comfort and functional ability ?Baseline: 10/10 at worst 02/08/2022 MET  ?  ?LONG TERM GOALS:  ?  ?LTG  Name Target Date Goal status  ?1 Pt will self report lower back and R LE pain no greater than 2/10 for improved comfort and functional  ability ?Baseline: 10/10 at worst 03/15/2022 INITIAL  ?2 Pt will improve FOTO function score to no less than 63% as proxy for functional improvement ?Baseline: 47% function 03/15/2022 INITIAL  ?3 Pt will increase 30 Second Sit to Stand rep count to no less than 10 reps for improved balance, strength, and functional mobility ?Baseline: 8 reps  03/15/2022 INITIAL  ?4 Pt will improve bilateral hip abductor strength to no less than 4/5 for improved functional mobility and decreased pain ?Baseline: see chart 03/15/2022 INITIAL  ?5 Pt will be able to stand for >45 min without increase in LBP for improved functional ability with work and ADLs ?Baseline: unable 03/15/2022 INITIAL  ?  ?PLAN: ?PT FREQUENCY: 2x/week ?  ?PT DURATION: 8 weeks ?  ?PLANNED INTERVENTIONS: Therapeutic exercises, Therapeutic activity, Neuromuscular re-education, Balance training, Gait training, Patient/Family education, Joint mobilization, Aquatic Therapy, Dry Needling, Electrical stimulation, Cryotherapy, Moist heat, and Manual therapy ?  ?PLAN FOR NEXT SESSION: assess HEP response,  ? ? ? ?Ward Chatters, PT ?03/01/2022, 5:55 PM ? ?   ?

## 2022-03-06 ENCOUNTER — Ambulatory Visit: Payer: 59

## 2022-03-06 DIAGNOSIS — G8929 Other chronic pain: Secondary | ICD-10-CM

## 2022-03-06 DIAGNOSIS — M6281 Muscle weakness (generalized): Secondary | ICD-10-CM | POA: Diagnosis not present

## 2022-03-06 DIAGNOSIS — R2689 Other abnormalities of gait and mobility: Secondary | ICD-10-CM | POA: Diagnosis not present

## 2022-03-06 DIAGNOSIS — M545 Low back pain, unspecified: Secondary | ICD-10-CM | POA: Diagnosis not present

## 2022-03-06 NOTE — Therapy (Signed)
?OUTPATIENT PHYSICAL THERAPY TREATMENT NOTE ? ? ?Patient Name: Alexandra Benjamin ?MRN: [id number] ?DOB:[birth year], 46 y.o., female ?Today's Date: 03/06/2022 ? ?PCP: Donney Dice, DO ?REFERRING PROVIDER: Donney Dice, DO ? ? PT End of Session - 03/06/22 1209   ? ? Visit Number 10   ? Number of Visits 17   ? Date for PT Re-Evaluation 03/14/22   ? Authorization Type [facility]   ? PT Start Time 1210   ? PT Stop Time 1253   ? PT Time Calculation (min) 43 min   ? Activity Tolerance Patient tolerated treatment well   ? Behavior During Therapy [facility] for tasks assessed/performed   ? ?  ?  ? ?  ? ? ? ? ? ? ? ? ? ? ?Past Medical History:  ?Diagnosis Date  ? Anxiety   ? Exposure to sexually transmitted disease (STD) 09/25/2019  ? Hypertension   ? Thyroid disease   ? ?Past Surgical History:  ?Procedure Laterality Date  ? FINGER FRACTURE SURGERY    ? TUBAL LIGATION    ? ?Patient Active Problem List  ? Diagnosis Date Noted  ? Mood changes 07/11/2021  ? Muscle strain 06/06/2021  ? Pre-diabetes 10/08/2020  ? Chest pain at rest 09/07/2020  ? HTN (hypertension), benign 03/12/2011  ? Obesity 03/12/2011  ? History of Graves' disease 02/12/2007  ? ? ?REFERRING DIAG:  ?M54.41 (ICD-10-CM) - Right-sided low back pain with right-sided sciatica, unspecified chronicity ? ?THERAPY DIAG:  ?Chronic low back pain, unspecified back pain laterality, unspecified whether sciatica present ? ?Muscle weakness (generalized) ? ?PERTINENT HISTORY:  ?None ? ?PRECAUTIONS:  ?None ? ?SUBJECTIVE:  ?Pt presents to PT with reports of lower back and R LE. Pt has been compliant with HEP with no adverse effect. Pt is ready to begin PT at this time.  ? ?PAIN:  ?Are you having pain? Yes ?NPRS scale: 1/10 ?Pain location: lower back ?Pain location: lower back, R LE ?PAIN TYPE: aching and sharp ?Pain description: intermittent  ?Aggravating factors: prolonged standing ?Relieving factors: heat,  ? ? ? ?OBJECTIVE:  ?    ?PATIENT SURVEYS:  ?FOTO 51% - 03/06/2022 ?  ?LE MMT: ?  ?MMT  Right ?01/18/2022 Left ?01/18/2022 Right ?02/22/2022 Left ?02/22/2022  ?Hip flexion  4/5 4/5 4/5 4+/5  ?Hip extension        ?Hip abduction 3+/5 3+/5 3+/5 4/5  ?Hip adduction        ?Hip external rotation        ?Hip internal rotation        ?Knee extension 5/5 5/5    ?Knee flexion 5/5 5/5    ?Ankle dorsiflexion         ?Ankle plantarflexion        ?Ankle inversion        ?Ankle eversion        ?Grossly        ?(Blank rows = not tested) ?  ?FUNCTIONAL TESTS:  ?30 seconds chair stand test: 8 reps ?  ?TODAY'S TREATMENT  ?[facility] Adult PT Treatment:                                                DATE: 03/06/2022 ?Therapeutic Exercise: ?NuStep lvl 6 UE/LE x 4 min while taking subjective  ?STS with 5# KB 2x10 ?Bridge 2x10 - 3" hold ?LTR x 10 ?Abdominal curl up 2x10  ?  Supine 90/90 alt toe taps 2x10 ?Supine SLR 3x5 each ?Standing hip abd/ext 2x10 25# ? ?[facility] Adult PT Treatment:                                                DATE: 03/01/2022 ?Therapeutic Exercise: ?STS with 5# KB 2x10 ?Bridge 2x10 - 3" hold ?Physioball rollout fwd/lat x 10 each ?LTR x 10 ?Supine 90/90 with UE flex/ext 5# 3x5 ?Supine 90/90 alt toe taps 3x10 ?Supine clamshell 2x15 Black TB ?Supine SLR 3x5 each ?Standing hip ext 2x10 25# ?PPT on physioball 2x10  ? ?[facility] Adult PT Treatment:                                                DATE: 02/27/2022 ?Therapeutic Exercise: ?NuStep lvl 6 x 4 min while taking subjective ?Bridge 2x10 - 3" hold ?Physioball rollout fwd/lat x 10 each ?LTR x 10 ?Supine 90/90 hold 3x20" ?Supine 90/90 alt toe taps 3x10 ?Supine piriformis stretch 2x30" R ?Supine clamshell 2x20 BTB ?Supine SLR 3x5 each ?STS 2x10 - no UE support ? ?PATIENT EDUCATION:  ?Education details: eval findings, FOTO, HEP, POC ?Person educated: Patient ?Education method: Explanation, Demonstration, and Handouts ?Education comprehension: verbalized understanding and returned demonstration ?  ?  ?HOME EXERCISE PROGRAM: ?Access Code: [email or web address] ?URL:  [email or web address] ?Date: 02/01/2022 ?Prepared by: Octavio Manns ? ?Exercises ?Supine Sciatic Nerve Glide - 1-2 x daily - 7 x weekly - 2 sets - 10 reps ?Supine Lower Trunk Rotation - 1-2 x daily - 7 x weekly - 2 sets - 10 reps ?Supine Piriformis Stretch with Leg Straight - 1-2 x daily - 7 x weekly - 2 reps - 30 sec hold ?Hooklying Clamshell with Resistance - 1-2 x daily - 7 x weekly - 3 sets - 10 reps ?Supine Posterior Pelvic Tilt - 1-2 x daily - 7 x weekly - 2 sets - 10 reps - 5 sec hold ?Active Straight Leg Raise with Quad Set - 1 x daily - 7 x weekly - 2 sets - 10 reps ?  ?  ?ASSESSMENT: ?  ?CLINICAL IMPRESSION: ?Pt was able to complete all prescribed exercises with no adverse effect. Therapy today continued to progress core and proximal hip strengthening in order to decrease pain and improve function. She continues to have R LE weakness, with increased fatigue noted during exercise on that extremity. Pt continues to benefit from skilled PT services and will continue to be seen and progressed as able.  ? ?  ?OBJECTIVE IMPAIRMENTS decreased balance, decreased endurance, decreased mobility, difficulty walking, decreased ROM, decreased strength, and pain.  ?  ?ACTIVITY LIMITATIONS community activity, occupation, and yard work.  ?  ?PERSONAL FACTORS Fitness, Time since onset of injury/illness/exacerbation, and 1-2 comorbidities: HTN, Graves disease   are also affecting patient's functional outcome.  ?  ?  ?GOALS: ?Goals reviewed with patient? No ?  ?SHORT TERM GOALS: ?  ?STG Name Target Date Goal status  ?1 Pt will be compliant and knowledgeable with initial HEP for improved comfort and carryover ?Baseline: initial HEP given 02/08/2022 MET  ?2 Pt will self report lower back and R LE pain no greater than 6/10 for improved comfort and functional ability ?Baseline: 10/10 at worst 02/08/2022 MET  ?  ?  LONG TERM GOALS:  ?  ?LTG Name Target Date Goal status  ?1 Pt will self report lower back and R LE pain no  greater than 2/10 for improved comfort and functional ability ?Baseline: 10/10 at worst 03/15/2022 ONGOING  ?2 Pt will improve FOTO function score to no less than 63% as proxy for functional improvement ?Baseline: 47% function ?03/06/2022: 51% function 03/15/2022 ONGOING  ?3 Pt will increase 30 Second Sit to Stand rep count to no less than 10 reps for improved balance, strength, and functional mobility ?Baseline: 8 reps  03/15/2022 ONGOING  ?4 Pt will improve bilateral hip abductor strength to no less than 4/5 for improved functional mobility and decreased pain ?Baseline: see chart 03/15/2022 ONGOING  ?5 Pt will be able to stand for >45 min without increase in LBP for improved functional ability with work and ADLs ?Baseline: unable 03/15/2022 ONGOING  ?  ?PLAN: ?PT FREQUENCY: 2x/week ?  ?PT DURATION: 8 weeks ?  ?PLANNED INTERVENTIONS: Therapeutic exercises, Therapeutic activity, Neuromuscular re-education, Balance training, Gait training, Patient/Family education, Joint mobilization, Aquatic Therapy, Dry Needling, Electrical stimulation, Cryotherapy, Moist heat, and Manual therapy ?  ?PLAN FOR NEXT SESSION: assess HEP response,  ? ? ? ?Ward Chatters, PT ?03/06/2022, 12:54 PM ? ?   ?

## 2022-03-08 ENCOUNTER — Ambulatory Visit: Payer: 59

## 2022-03-08 DIAGNOSIS — G8929 Other chronic pain: Secondary | ICD-10-CM

## 2022-03-08 DIAGNOSIS — M6281 Muscle weakness (generalized): Secondary | ICD-10-CM

## 2022-03-08 DIAGNOSIS — R2689 Other abnormalities of gait and mobility: Secondary | ICD-10-CM | POA: Diagnosis not present

## 2022-03-08 DIAGNOSIS — M545 Low back pain, unspecified: Secondary | ICD-10-CM | POA: Diagnosis not present

## 2022-03-08 NOTE — Therapy (Signed)
?OUTPATIENT PHYSICAL THERAPY TREATMENT NOTE ? ? ?Patient Name: Alexandra Benjamin ?MRN: [id number] ?DOB:1976-01-23, 46 y.o., female ?Today's Date: 03/08/2022 ? ?PCP: Donney Dice, DO ?REFERRING PROVIDER: Zenia Resides, MD ? ? PT End of Session - 03/08/22 1214   ? ? Visit Number 11   ? Number of Visits 17   ? Date for PT Re-Evaluation 03/14/22   ? Authorization Type [facility]   ? PT Start Time 1215   ? PT Stop Time 1224   ? PT Time Calculation (min) 30 min   ? Activity Tolerance Patient tolerated treatment well   ? Behavior During Therapy [facility] for tasks assessed/performed   ? ?  ?  ? ?  ? ? ? ? ? ? ? ? ? ? ? ?Past Medical History:  ?Diagnosis Date  ? Anxiety   ? Exposure to sexually transmitted disease (STD) 09/25/2019  ? Hypertension   ? Thyroid disease   ? ?Past Surgical History:  ?Procedure Laterality Date  ? FINGER FRACTURE SURGERY    ? TUBAL LIGATION    ? ?Patient Active Problem List  ? Diagnosis Date Noted  ? Mood changes 07/11/2021  ? Muscle strain 06/06/2021  ? Pre-diabetes 10/08/2020  ? Chest pain at rest 09/07/2020  ? HTN (hypertension), benign 03/12/2011  ? Obesity 03/12/2011  ? History of Graves' disease 02/12/2007  ? ? ?REFERRING DIAG:  ?M54.41 (ICD-10-CM) - Right-sided low back pain with right-sided sciatica, unspecified chronicity ? ?THERAPY DIAG:  ?Chronic low back pain, unspecified back pain laterality, unspecified whether sciatica present ? ?Muscle weakness (generalized) ? ?PERTINENT HISTORY:  ?None ? ?PRECAUTIONS:  ?None ? ?SUBJECTIVE:  ?Pt presents to PT with continued reports of decreased pain. She notes that she has been feeling better lately and taking more rest breaks. Pt is ready to begin PT at this time.  ? ?PAIN:  ?Are you having pain? Yes ?NPRS scale: 1/10 ?Pain location: lower back ?Pain location: lower back, R LE ?PAIN TYPE: aching and sharp ?Pain description: intermittent  ?Aggravating factors: prolonged standing ?Relieving factors: heat,  ? ? ? ?OBJECTIVE:  ?    ?PATIENT SURVEYS:  ?FOTO  51% - 03/06/2022 ?  ?LE MMT: ?  ?MMT Right ?01/18/2022 Left ?01/18/2022 Right ?02/22/2022 Left ?02/22/2022  ?Hip flexion  4/5 4/5 4/5 4+/5  ?Hip extension        ?Hip abduction 3+/5 3+/5 3+/5 4/5  ?Hip adduction        ?Hip external rotation        ?Hip internal rotation        ?Knee extension 5/5 5/5    ?Knee flexion 5/5 5/5    ?Ankle dorsiflexion         ?Ankle plantarflexion        ?Ankle inversion        ?Ankle eversion        ?Grossly        ?(Blank rows = not tested) ?  ?FUNCTIONAL TESTS:  ?30 seconds chair stand test: 8 reps ?  ?TODAY'S TREATMENT  ?[facility] Adult PT Treatment:                                                DATE: 03/08/2022 ?Therapeutic Exercise: ?NuStep lvl 6 UE/LE x 3 min while taking subjective  ?STS with 10# KB 2x10 ?Bridge 2x10 - 3" hold ?Abdominal curl up  2x10  ?Supine 90/90 alt toe taps 2x10 ?Supine SLR 3x5 each ?Standing hip abd/ext 2x10 25# ? ?[facility] Adult PT Treatment:                                                DATE: 03/06/2022 ?Therapeutic Exercise: ?NuStep lvl 6 UE/LE x 4 min while taking subjective  ?STS with 5# KB 2x10 ?Bridge 2x10 - 3" hold ?LTR x 10 ?Abdominal curl up 2x10  ?Supine 90/90 alt toe taps 2x10 ?Supine SLR 3x5 each ?Standing hip abd/ext 2x10 25# ? ?[facility] Adult PT Treatment:                                                DATE: 03/01/2022 ?Therapeutic Exercise: ?STS with 5# KB 2x10 ?Bridge 2x10 - 3" hold ?Physioball rollout fwd/lat x 10 each ?LTR x 10 ?Supine 90/90 with UE flex/ext 5# 3x5 ?Supine 90/90 alt toe taps 3x10 ?Supine clamshell 2x15 Black TB ?Supine SLR 3x5 each ?Standing hip ext 2x10 25# ?PPT on physioball 2x10  ? ?PATIENT EDUCATION:  ?Education details: eval findings, FOTO, HEP, POC ?Person educated: Patient ?Education method: Explanation, Demonstration, and Handouts ?Education comprehension: verbalized understanding and returned demonstration ?  ?  ?HOME EXERCISE PROGRAM: ?Access Code: [email or web address] ?URL: [email or web address] ?Date: 02/01/2022 ?Prepared by:  Octavio Manns ? ?Exercises ?Supine Sciatic Nerve Glide - 1-2 x daily - 7 x weekly - 2 sets - 10 reps ?Supine Lower Trunk Rotation - 1-2 x daily - 7 x weekly - 2 sets - 10 reps ?Supine Piriformis Stretch with Leg Straight - 1-2 x daily - 7 x weekly - 2 reps - 30 sec hold ?Hooklying Clamshell with Resistance - 1-2 x daily - 7 x weekly - 3 sets - 10 reps ?Supine Posterior Pelvic Tilt - 1-2 x daily - 7 x weekly - 2 sets - 10 reps - 5 sec hold ?Active Straight Leg Raise with Quad Set - 1 x daily - 7 x weekly - 2 sets - 10 reps ?  ?  ?ASSESSMENT: ?  ?CLINICAL IMPRESSION: ?Pt was again able to complete prescribed exercises with no adverse effect. She continues to progress well with therapy, noting decreases in pain and improving functional activity tolerance. Therapy today continued to progress core and proximal hip strengthening in order to decrease pain and improve function. Pt continues to benefit from skilled PT services and will continue to be seen and progressed as able.  ? ?  ?OBJECTIVE IMPAIRMENTS decreased balance, decreased endurance, decreased mobility, difficulty walking, decreased ROM, decreased strength, and pain.  ?  ?ACTIVITY LIMITATIONS community activity, occupation, and yard work.  ?  ?PERSONAL FACTORS Fitness, Time since onset of injury/illness/exacerbation, and 1-2 comorbidities: HTN, Graves disease   are also affecting patient's functional outcome.  ?  ?  ?GOALS: ?Goals reviewed with patient? No ?  ?SHORT TERM GOALS: ?  ?STG Name Target Date Goal status  ?1 Pt will be compliant and knowledgeable with initial HEP for improved comfort and carryover ?Baseline: initial HEP given 02/08/2022 MET  ?2 Pt will self report lower back and R LE pain no greater than 6/10 for improved comfort and functional ability ?Baseline: 10/10 at worst 02/08/2022 MET  ?  ?LONG TERM GOALS:  ?  ?  LTG Name Target Date Goal status  ?1 Pt will self report lower back and R LE pain no greater than 2/10 for improved comfort and functional  ability ?Baseline: 10/10 at worst 03/15/2022 ONGOING  ?2 Pt will improve FOTO function score to no less than 63% as proxy for functional improvement ?Baseline: 47% function ?03/06/2022: 51% function 03/15/2022 ONGOING  ?3 Pt will increase 30 Second Sit to Stand rep count to no less than 10 reps for improved balance, strength, and functional mobility ?Baseline: 8 reps  03/15/2022 ONGOING  ?4 Pt will improve bilateral hip abductor strength to no less than 4/5 for improved functional mobility and decreased pain ?Baseline: see chart 03/15/2022 ONGOING  ?5 Pt will be able to stand for >45 min without increase in LBP for improved functional ability with work and ADLs ?Baseline: unable 03/15/2022 ONGOING  ?  ?PLAN: ?PT FREQUENCY: 2x/week ?  ?PT DURATION: 8 weeks ?  ?PLANNED INTERVENTIONS: Therapeutic exercises, Therapeutic activity, Neuromuscular re-education, Balance training, Gait training, Patient/Family education, Joint mobilization, Aquatic Therapy, Dry Needling, Electrical stimulation, Cryotherapy, Moist heat, and Manual therapy ?  ?PLAN FOR NEXT SESSION: assess HEP response,  ? ? ? ?Ward Chatters, PT ?03/08/2022, 12:46 PM ? ?   ?

## 2022-03-12 NOTE — Therapy (Addendum)
?OUTPATIENT PHYSICAL THERAPY TREATMENT NOTE ? ? ?Patient Name: Alexandra Benjamin ?MRN: [id number] ?DOB:January 16, 1976, 46 y.o., female ?Today's Date: 03/13/2022 ? ?PCP: Donney Dice, DO ?REFERRING PROVIDER: Donney Dice, DO ? ? PT End of Session - 03/13/22 1221   ? ? Visit Number 12   ? Number of Visits 17   ? Date for PT Re-Evaluation 03/14/22   ? Authorization Type [facility]   ? PT Start Time 1221   ? PT Stop Time 1259   ? PT Time Calculation (min) 38 min   ? Activity Tolerance Patient tolerated treatment well   ? Behavior During Therapy [facility] for tasks assessed/performed   ? ?  ?  ? ?  ? ? ? ? ? ? ? ? ? ? ? ? ?Past Medical History:  ?Diagnosis Date  ? Anxiety   ? Exposure to sexually transmitted disease (STD) 09/25/2019  ? Hypertension   ? Thyroid disease   ? ?Past Surgical History:  ?Procedure Laterality Date  ? FINGER FRACTURE SURGERY    ? TUBAL LIGATION    ? ?Patient Active Problem List  ? Diagnosis Date Noted  ? Mood changes 07/11/2021  ? Muscle strain 06/06/2021  ? Pre-diabetes 10/08/2020  ? Chest pain at rest 09/07/2020  ? HTN (hypertension), benign 03/12/2011  ? Obesity 03/12/2011  ? History of Graves' disease 02/12/2007  ? ? ?REFERRING DIAG:  ?M54.41 (ICD-10-CM) - Right-sided low back pain with right-sided sciatica, unspecified chronicity ? ?THERAPY DIAG:  ?Chronic low back pain, unspecified back pain laterality, unspecified whether sciatica present ? ?Muscle weakness (generalized) ? ?PERTINENT HISTORY:  ?None ? ?PRECAUTIONS:  ?None ? ?SUBJECTIVE:  ?Pt presents to PT with no current reports of pain. Has been compliant with HEP with no adverse effect. Pt is ready to begin PT at this time.  ? ?PAIN:  ?Are you having pain? No ?NPRS scale: 0/10 ?Pain location: lower back ?Pain location: lower back, R LE ?PAIN TYPE: aching and sharp ?Pain description: intermittent  ?Aggravating factors: prolonged standing ?Relieving factors: heat,  ? ? ? ?OBJECTIVE:  ?    ?PATIENT SURVEYS:  ?FOTO 51% - 03/06/2022 ?  ?LE MMT: ?  ?MMT  Right ?01/18/2022 Left ?01/18/2022 Right ?02/22/2022 Left ?02/22/2022  ?Hip flexion  4/5 4/5 4/5 4+/5  ?Hip extension        ?Hip abduction 3+/5 3+/5 3+/5 4/5  ?Hip adduction        ?Hip external rotation        ?Hip internal rotation        ?Knee extension 5/5 5/5    ?Knee flexion 5/5 5/5    ?Ankle dorsiflexion         ?Ankle plantarflexion        ?Ankle inversion        ?Ankle eversion        ?Grossly        ?(Blank rows = not tested) ?  ?FUNCTIONAL TESTS:  ?30 seconds chair stand test: 8 reps ?  ?TODAY'S TREATMENT  ?[facility] Adult PT Treatment:                                                DATE: 03/13/2022 ?Therapeutic Exercise: ?NuStep lvl 6 UE/LE x 3.5 min while taking subjective ?Paloff press 2x10 7#  ?STS with 10# KB 2x10 ?Bridge x 15 - 3" hold ?Abdominal curl up  2x10  ?Supine 90/90 alt toe taps 2x10 ?Supine piriformis stretch x 30" R ?Supine SLR 3x5 each ?Physioball rollout fwd/lat x 10 each ?Standing hip abd/ext x 10 30# ? ?[facility] Adult PT Treatment:                                                DATE: 03/08/2022 ?Therapeutic Exercise: ?NuStep lvl 6 UE/LE x 3 min while taking subjective  ?STS with 10# KB 2x10 ?Bridge 2x10 - 3" hold ?Abdominal curl up 2x10  ?Supine 90/90 alt toe taps 2x10 ?Supine SLR 3x5 each ?Standing hip abd/ext 2x10 25# ? ?[facility] Adult PT Treatment:                                                DATE: 03/06/2022 ?Therapeutic Exercise: ?NuStep lvl 6 UE/LE x 4 min while taking subjective  ?STS with 5# KB 2x10 ?Bridge 2x10 - 3" hold ?LTR x 10 ?Abdominal curl up 2x10  ?Supine 90/90 alt toe taps 2x10 ?Supine SLR 3x5 each ?Standing hip abd/ext 2x10 25# ? ?PATIENT EDUCATION:  ?Education details: eval findings, FOTO, HEP, POC ?Person educated: Patient ?Education method: Explanation, Demonstration, and Handouts ?Education comprehension: verbalized understanding and returned demonstration ?  ?  ?HOME EXERCISE PROGRAM: ?Access Code: [email or web address] ?URL: [email or web address] ?Date: 02/01/2022 ?Prepared by:  Octavio Manns ? ?Exercises ?Supine Sciatic Nerve Glide - 1-2 x daily - 7 x weekly - 2 sets - 10 reps ?Supine Lower Trunk Rotation - 1-2 x daily - 7 x weekly - 2 sets - 10 reps ?Supine Piriformis Stretch with Leg Straight - 1-2 x daily - 7 x weekly - 2 reps - 30 sec hold ?Hooklying Clamshell with Resistance - 1-2 x daily - 7 x weekly - 3 sets - 10 reps ?Supine Posterior Pelvic Tilt - 1-2 x daily - 7 x weekly - 2 sets - 10 reps - 5 sec hold ?Active Straight Leg Raise with Quad Set - 1 x daily - 7 x weekly - 2 sets - 10 reps ?  ?  ?ASSESSMENT: ?  ?CLINICAL IMPRESSION: ?Pt was again able to complete all prescribed exercises with no adverse effect. Therapy continued to progress core and proximal hip strengthening in order to decrease pain and improve function. She continues to progress very well with therapy and will continue to be seen and progressed as able.  ? ?  ?OBJECTIVE IMPAIRMENTS decreased balance, decreased endurance, decreased mobility, difficulty walking, decreased ROM, decreased strength, and pain.  ?  ?ACTIVITY LIMITATIONS community activity, occupation, and yard work.  ?  ?PERSONAL FACTORS Fitness, Time since onset of injury/illness/exacerbation, and 1-2 comorbidities: HTN, Graves disease   are also affecting patient's functional outcome.  ?  ?  ?GOALS: ?Goals reviewed with patient? No ?  ?SHORT TERM GOALS: ?  ?STG Name Target Date Goal status  ?1 Pt will be compliant and knowledgeable with initial HEP for improved comfort and carryover ?Baseline: initial HEP given 02/08/2022 MET  ?2 Pt will self report lower back and R LE pain no greater than 6/10 for improved comfort and functional ability ?Baseline: 10/10 at worst 02/08/2022 MET  ?  ?LONG TERM GOALS:  ?  ?LTG Name Target Date Goal status  ?1 Pt will self  report lower back and R LE pain no greater than 2/10 for improved comfort and functional ability ?Baseline: 10/10 at worst 03/15/2022 ONGOING  ?2 Pt will improve FOTO function score to no less than 63% as  proxy for functional improvement ?Baseline: 47% function ?03/06/2022: 51% function 03/15/2022 ONGOING  ?3 Pt will increase 30 Second Sit to Stand rep count to no less than 10 reps for improved balance, strength, and functional mobility ?Baseline: 8 reps  03/15/2022 ONGOING  ?4 Pt will improve bilateral hip abductor strength to no less than 4/5 for improved functional mobility and decreased pain ?Baseline: see chart 03/15/2022 ONGOING  ?5 Pt will be able to stand for >45 min without increase in LBP for improved functional ability with work and ADLs ?Baseline: unable 03/15/2022 ONGOING  ?  ?PLAN: ?PT FREQUENCY: 2x/week ?  ?PT DURATION: 8 weeks ?  ?PLANNED INTERVENTIONS: Therapeutic exercises, Therapeutic activity, Neuromuscular re-education, Balance training, Gait training, Patient/Family education, Joint mobilization, Aquatic Therapy, Dry Needling, Electrical stimulation, Cryotherapy, Moist heat, and Manual therapy ?  ?PLAN FOR NEXT SESSION: assess HEP response,  ? ? ? ?Ward Chatters, PT ?03/13/2022, 1:00 PM ? ?   ?

## 2022-03-13 ENCOUNTER — Ambulatory Visit: Payer: 59

## 2022-03-13 DIAGNOSIS — M6281 Muscle weakness (generalized): Secondary | ICD-10-CM

## 2022-03-13 DIAGNOSIS — M545 Low back pain, unspecified: Secondary | ICD-10-CM | POA: Diagnosis not present

## 2022-03-13 DIAGNOSIS — G8929 Other chronic pain: Secondary | ICD-10-CM

## 2022-03-13 DIAGNOSIS — R2689 Other abnormalities of gait and mobility: Secondary | ICD-10-CM | POA: Diagnosis not present

## 2022-03-15 ENCOUNTER — Ambulatory Visit: Payer: 59

## 2022-03-15 DIAGNOSIS — G8929 Other chronic pain: Secondary | ICD-10-CM | POA: Diagnosis not present

## 2022-03-15 DIAGNOSIS — M545 Low back pain, unspecified: Secondary | ICD-10-CM | POA: Diagnosis not present

## 2022-03-15 DIAGNOSIS — M6281 Muscle weakness (generalized): Secondary | ICD-10-CM | POA: Diagnosis not present

## 2022-03-15 DIAGNOSIS — R2689 Other abnormalities of gait and mobility: Secondary | ICD-10-CM | POA: Diagnosis not present

## 2022-03-15 NOTE — Therapy (Signed)
?OUTPATIENT PHYSICAL THERAPY TREATMENT NOTE/RE-CERT ? ? ?Patient Name: Alexandra Benjamin ?MRN: [id number] ?DOB:08-Dec-1975, 46 y.o., female ?Today's Date: 03/15/2022 ? ?PCP: Donney Dice, DO ?REFERRING PROVIDER: Zenia Resides, MD ? ? PT End of Session - 03/15/22 1214   ? ? Visit Number 13   ? Number of Visits 25   ? Date for PT Re-Evaluation 04/26/22   ? Authorization Type [facility]   ? PT Start Time 1215   ? PT Stop Time 1258   ? PT Time Calculation (min) 43 min   ? Activity Tolerance Patient tolerated treatment well   ? Behavior During Therapy [facility] for tasks assessed/performed   ? ?  ?  ? ?  ? ? ? ? ? ? ? ? ? ? ? ? ? ?Past Medical History:  ?Diagnosis Date  ? Anxiety   ? Exposure to sexually transmitted disease (STD) 09/25/2019  ? Hypertension   ? Thyroid disease   ? ?Past Surgical History:  ?Procedure Laterality Date  ? FINGER FRACTURE SURGERY    ? TUBAL LIGATION    ? ?Patient Active Problem List  ? Diagnosis Date Noted  ? Mood changes 07/11/2021  ? Muscle strain 06/06/2021  ? Pre-diabetes 10/08/2020  ? Chest pain at rest 09/07/2020  ? HTN (hypertension), benign 03/12/2011  ? Obesity 03/12/2011  ? History of Graves' disease 02/12/2007  ? ? ?REFERRING DIAG:  ?M54.41 (ICD-10-CM) - Right-sided low back pain with right-sided sciatica, unspecified chronicity ? ?THERAPY DIAG:  ?Chronic low back pain, unspecified back pain laterality, unspecified whether sciatica present - Plan: PT plan of care cert/re-cert ? ?Muscle weakness (generalized) - Plan: PT plan of care cert/re-cert ? ?PERTINENT HISTORY:  ?None ? ?PRECAUTIONS:  ?None ? ?SUBJECTIVE:  ?Pt presents to PT with reports of increased lower back pain. Has been compliant with her HEP with no adverse effect. Pt is ready to begin PT at this time.  ? ?PAIN:  ?Are you having pain? Yes ?NPRS scale: 5/10 ?Pain location: lower back ?Pain location: lower back, R LE ?PAIN TYPE: aching and sharp ?Pain description: intermittent  ?Aggravating factors: prolonged standing ?Relieving  factors: heat,  ? ? ? ?OBJECTIVE:  ?    ?PATIENT SURVEYS:  ?FOTO 52% - 03/06/2022 ?  ?LE MMT: ?  ?MMT Right ?01/18/2022 Left ?01/18/2022 Right ?02/22/2022 Left ?02/22/2022  ?Hip flexion  4/5 4/5 4/5 4+/5  ?Hip extension        ?Hip abduction 3+/5 3+/5 3+/5 4/5  ?Hip adduction        ?Hip external rotation        ?Hip internal rotation        ?Knee extension 5/5 5/5    ?Knee flexion 5/5 5/5    ?Ankle dorsiflexion         ?Ankle plantarflexion        ?Ankle inversion        ?Ankle eversion        ?Grossly        ?(Blank rows = not tested) ?  ?FUNCTIONAL TESTS:  ?30 seconds chair stand test: 8 reps - no UE support (03/15/2022) ?  ?TODAY'S TREATMENT  ?[facility] Adult PT Treatment:                                                DATE: 03/15/2022 ?Therapeutic Exercise: ?NuStep lvl 5 UE/LE x 4  min while taking subjective ?Bridge x 15 - 3" hold ?STS x 10 ?Abdominal curl up 2x10  ?Supine 90/90 alt toe taps 2x10 ?LTR x 10  ?S/L clamshell 2x10 RTB ?Paloff press 2x10 7# ?Physioball PPT x 15 - 5" ? ?[facility] Adult PT Treatment:                                                DATE: 03/13/2022 ?Therapeutic Exercise: ?NuStep lvl 6 UE/LE x 3.5 min while taking subjective ?Paloff press 2x10 7#  ?STS with 10# KB 2x10 ?Bridge x 15 - 3" hold ?Abdominal curl up 2x10  ?Supine 90/90 alt toe taps 2x10 ?Supine piriformis stretch x 30" R ?Supine SLR 3x5 each ?Physioball rollout fwd/lat x 10 each ?Standing hip abd/ext x 10 30# ? ?[facility] Adult PT Treatment:                                                DATE: 03/08/2022 ?Therapeutic Exercise: ?NuStep lvl 6 UE/LE x 3 min while taking subjective  ?STS with 10# KB 2x10 ?Bridge 2x10 - 3" hold ?Abdominal curl up 2x10  ?Supine 90/90 alt toe taps 2x10 ?Supine SLR 3x5 each ?Standing hip abd/ext 2x10 25# ? ?PATIENT EDUCATION:  ?Education details: eval findings, FOTO, HEP, POC ?Person educated: Patient ?Education method: Explanation, Demonstration, and Handouts ?Education comprehension: verbalized understanding and returned  demonstration ?  ?  ?HOME EXERCISE PROGRAM: ?Access Code: [email or web address] ?URL: [email or web address] ?Date: 02/01/2022 ?Prepared by: Octavio Manns ? ?Exercises ?Supine Sciatic Nerve Glide - 1-2 x daily - 7 x weekly - 2 sets - 10 reps ?Supine Lower Trunk Rotation - 1-2 x daily - 7 x weekly - 2 sets - 10 reps ?Supine Piriformis Stretch with Leg Straight - 1-2 x daily - 7 x weekly - 2 reps - 30 sec hold ?Hooklying Clamshell with Resistance - 1-2 x daily - 7 x weekly - 3 sets - 10 reps ?Supine Posterior Pelvic Tilt - 1-2 x daily - 7 x weekly - 2 sets - 10 reps - 5 sec hold ?Active Straight Leg Raise with Quad Set - 1 x daily - 7 x weekly - 2 sets - 10 reps ?  ?  ?ASSESSMENT: ?  ?CLINICAL IMPRESSION: ?Pt was able to complete all prescribed exercises with no adverse effect or increase in pain. She has continued to progress well with therapy, with decreased pain, improving strength, and improved FOTO score. She does continue to have pain and decreased proximal hip strength, R>L. She would benefit from continued skilled PT services and will continue to be seen and progressed as able.  ? ?  ?OBJECTIVE IMPAIRMENTS decreased balance, decreased endurance, decreased mobility, difficulty walking, decreased ROM, decreased strength, and pain.  ?  ?ACTIVITY LIMITATIONS community activity, occupation, and yard work.  ?  ?PERSONAL FACTORS Fitness, Time since onset of injury/illness/exacerbation, and 1-2 comorbidities: HTN, Graves disease   are also affecting patient's functional outcome.  ?  ?  ?GOALS: ?Goals reviewed with patient? No ?  ?SHORT TERM GOALS: ?  ?STG Name Target Date Goal status  ?1 Pt will be compliant and knowledgeable with initial HEP for improved comfort and carryover ?Baseline: initial HEP given 02/08/2022 MET  ?2 Pt will self report  lower back and R LE pain no greater than 6/10 for improved comfort and functional ability ?Baseline: 10/10 at worst 02/08/2022 MET  ?  ?LONG TERM GOALS:  ?  ?LTG Name Target Date  Goal status  ?1 Pt will self report lower back and R LE pain no greater than 2/10 for improved comfort and functional ability ?Baseline: 10/10 at worst 04/26/2022 ONGOING  ?2 Pt will improve FOTO function score to no less than 63% as proxy for functional improvement ?Baseline: 47% function ?03/06/2022: 51% function 04/26/2022 ONGOING  ?3 Pt will increase 30 Second Sit to Stand rep count to no less than 10 reps for improved balance, strength, and functional mobility ?Baseline: 8 reps  ?03/15/2022: 8 reps - no UE support 04/26/2022 ONGOING  ?4 Pt will improve bilateral hip abductor strength to no less than 4/5 for improved functional mobility and decreased pain ?Baseline: see chart 04/26/2022 ONGOING  ?5 Pt will be able to stand for >45 min without increase in LBP for improved functional ability with work and ADLs ?Baseline: unable 04/26/2022 ONGOING  ?  ?PLAN: ?PT FREQUENCY: 2x/week ?  ?PT DURATION: 6 weeks ?  ?PLANNED INTERVENTIONS: Therapeutic exercises, Therapeutic activity, Neuromuscular re-education, Balance training, Gait training, Patient/Family education, Joint mobilization, Aquatic Therapy, Dry Needling, Electrical stimulation, Cryotherapy, Moist heat, and Manual therapy ?  ?PLAN FOR NEXT SESSION: assess HEP response,  ? ? ? ?Ward Chatters, PT ?03/15/2022, 1:41 PM ? ?   ?

## 2022-03-24 NOTE — Therapy (Signed)
?OUTPATIENT PHYSICAL THERAPY TREATMENT NOTE/RE-CERT ? ? ?Patient Name: Alexandra Benjamin ?MRN: [id number] ?DOB:03/06/1976, 46 y.o., female ?Today's Date: 03/27/2022 ? ?PCP: Donney Dice, DO ?REFERRING PROVIDER: Zenia Resides, MD ? ? PT End of Session - 03/27/22 1609   ? ? Visit Number 14   ? Number of Visits 25   ? Date for PT Re-Evaluation 04/26/22   ? Authorization Type [facility]   ? PT Start Time 1610   ? PT Stop Time 1655   ? PT Time Calculation (min) 45 min   ? Activity Tolerance Patient tolerated treatment well   ? Behavior During Therapy [facility] for tasks assessed/performed   ? ?  ?  ? ?  ? ? ? ? ? ? ? ? ? ? ? ? ? ? ?Past Medical History:  ?Diagnosis Date  ? Anxiety   ? Exposure to sexually transmitted disease (STD) 09/25/2019  ? Hypertension   ? Thyroid disease   ? ?Past Surgical History:  ?Procedure Laterality Date  ? FINGER FRACTURE SURGERY    ? TUBAL LIGATION    ? ?Patient Active Problem List  ? Diagnosis Date Noted  ? Mood changes 07/11/2021  ? Muscle strain 06/06/2021  ? Pre-diabetes 10/08/2020  ? Chest pain at rest 09/07/2020  ? HTN (hypertension), benign 03/12/2011  ? Obesity 03/12/2011  ? History of Graves' disease 02/12/2007  ? ? ?REFERRING DIAG:  ?M54.41 (ICD-10-CM) - Right-sided low back pain with right-sided sciatica, unspecified chronicity ? ?THERAPY DIAG:  ?Chronic low back pain, unspecified back pain laterality, unspecified whether sciatica present ? ?Muscle weakness (generalized) ? ?Other abnormalities of gait and mobility ? ?PERTINENT HISTORY:  ?None ? ?PRECAUTIONS:  ?None ? ?SUBJECTIVE: Patient presents to PT with decreased overall low back pain. She reports HEP compliance.  ? ?PAIN:  ?Are you having pain? Yes ?NPRS scale: 2/10 ?Pain location: lower back ?Pain location: lower back, R LE ?PAIN TYPE: aching and sharp ?Pain description: intermittent  ?Aggravating factors: prolonged standing ?Relieving factors: heat,  ? ? ? ?OBJECTIVE:  ?    ?PATIENT SURVEYS:  ?FOTO 52% - 03/06/2022 ?  ?LE MMT: ?  ?MMT  Right ?01/18/2022 Left ?01/18/2022 Right ?02/22/2022 Left ?02/22/2022  ?Hip flexion  4/5 4/5 4/5 4+/5  ?Hip extension        ?Hip abduction 3+/5 3+/5 3+/5 4/5  ?Hip adduction        ?Hip external rotation        ?Hip internal rotation        ?Knee extension 5/5 5/5    ?Knee flexion 5/5 5/5    ?Ankle dorsiflexion         ?Ankle plantarflexion        ?Ankle inversion        ?Ankle eversion        ?Grossly        ?(Blank rows = not tested) ?  ?FUNCTIONAL TESTS:  ?30 seconds chair stand test: 8 reps - no UE support (03/15/2022) ?  ?TODAY'S TREATMENT  ?[facility] Adult PT Treatment:                                                DATE: 03/27/2022 ?Therapeutic Exercise: ?NuStep lvl 5 UE/LE x 5 min while taking subjective ?Bridge x 15 - 3" hold ?Supine 90/90 alt toe taps 1x10, 1x8 ?Dead bugs 2x5 BIL ?Supine piriformis  stretch 2x30" BIL ?LTR x 10  ?Paloff press 2x10 7# ?Pball roll out x10 each fwd/lateral ?Cybex hip abduction/extension 25# 2x10 BIL ? ? ?[facility] Adult PT Treatment:                                                DATE: 03/15/2022 ?Therapeutic Exercise: ?NuStep lvl 5 UE/LE x 4 min while taking subjective ?Bridge x 15 - 3" hold ?STS x 10 ?Abdominal curl up 2x10  ?Supine 90/90 alt toe taps 2x10 ?LTR x 10  ?S/L clamshell 2x10 RTB ?Paloff press 2x10 7# ?Physioball PPT x 15 - 5" ? ?[facility] Adult PT Treatment:                                                DATE: 03/13/2022 ?Therapeutic Exercise: ?NuStep lvl 6 UE/LE x 3.5 min while taking subjective ?Paloff press 2x10 7#  ?STS with 10# KB 2x10 ?Bridge x 15 - 3" hold ?Abdominal curl up 2x10  ?Supine 90/90 alt toe taps 2x10 ?Supine piriformis stretch x 30" R ?Supine SLR 3x5 each ?Physioball rollout fwd/lat x 10 each ?Standing hip abd/ext x 10 30# ? ? ?PATIENT EDUCATION:  ?Education details: eval findings, FOTO, HEP, POC ?Person educated: Patient ?Education method: Explanation, Demonstration, and Handouts ?Education comprehension: verbalized understanding and returned demonstration ?  ?  ?HOME  EXERCISE PROGRAM: ?Access Code: [email or web address] ?URL: [email or web address] ?Date: 02/01/2022 ?Prepared by: Octavio Manns ? ?Exercises ?Supine Sciatic Nerve Glide - 1-2 x daily - 7 x weekly - 2 sets - 10 reps ?Supine Lower Trunk Rotation - 1-2 x daily - 7 x weekly - 2 sets - 10 reps ?Supine Piriformis Stretch with Leg Straight - 1-2 x daily - 7 x weekly - 2 reps - 30 sec hold ?Hooklying Clamshell with Resistance - 1-2 x daily - 7 x weekly - 3 sets - 10 reps ?Supine Posterior Pelvic Tilt - 1-2 x daily - 7 x weekly - 2 sets - 10 reps - 5 sec hold ?Active Straight Leg Raise with Quad Set - 1 x daily - 7 x weekly - 2 sets - 10 reps ?  ?  ?ASSESSMENT: ?  ?CLINICAL IMPRESSION: ?Patient presents to PT with lower levels of back pain and reports HEP compliance. Session today focused on proximal hip and core strengthening. Patient was able to tolerate all prescribed exercises with no adverse effects. Patient continues to benefit from skilled PT services and should be progressed as able to improve functional independence. ? ?  ?OBJECTIVE IMPAIRMENTS decreased balance, decreased endurance, decreased mobility, difficulty walking, decreased ROM, decreased strength, and pain.  ?  ?ACTIVITY LIMITATIONS community activity, occupation, and yard work.  ?  ?PERSONAL FACTORS Fitness, Time since onset of injury/illness/exacerbation, and 1-2 comorbidities: HTN, Graves disease   are also affecting patient's functional outcome.  ?  ?  ?GOALS: ?Goals reviewed with patient? No ?  ?SHORT TERM GOALS: ?  ?STG Name Target Date Goal status  ?1 Pt will be compliant and knowledgeable with initial HEP for improved comfort and carryover ?Baseline: initial HEP given 02/08/2022 MET  ?2 Pt will self report lower back and R LE pain no greater than 6/10 for improved comfort and functional ability ?Baseline: 10/10 at worst 02/08/2022  MET  ?  ?LONG TERM GOALS:  ?  ?LTG Name Target Date Goal status  ?1 Pt will self report lower back and R LE pain no  greater than 2/10 for improved comfort and functional ability ?Baseline: 10/10 at worst 04/26/2022 ONGOING  ?2 Pt will improve FOTO function score to no less than 63% as proxy for functional improvement ?Baseline: 47% function ?03/06/2022: 51% function 04/26/2022 ONGOING  ?3 Pt will increase 30 Second Sit to Stand rep count to no less than 10 reps for improved balance, strength, and functional mobility ?Baseline: 8 reps  ?03/15/2022: 8 reps - no UE support 04/26/2022 ONGOING  ?4 Pt will improve bilateral hip abductor strength to no less than 4/5 for improved functional mobility and decreased pain ?Baseline: see chart 04/26/2022 ONGOING  ?5 Pt will be able to stand for >45 min without increase in LBP for improved functional ability with work and ADLs ?Baseline: unable 04/26/2022 ONGOING  ?  ?PLAN: ?PT FREQUENCY: 2x/week ?  ?PT DURATION: 6 weeks ?  ?PLANNED INTERVENTIONS: Therapeutic exercises, Therapeutic activity, Neuromuscular re-education, Balance training, Gait training, Patient/Family education, Joint mobilization, Aquatic Therapy, Dry Needling, Electrical stimulation, Cryotherapy, Moist heat, and Manual therapy ?  ?PLAN FOR NEXT SESSION: assess HEP response,  ? ? ? ?Evelene Croon, PTA ?03/27/2022, 4:59 PM ? ?   ?

## 2022-03-27 ENCOUNTER — Ambulatory Visit: Payer: 59 | Attending: Family Medicine

## 2022-03-27 DIAGNOSIS — M6281 Muscle weakness (generalized): Secondary | ICD-10-CM | POA: Diagnosis not present

## 2022-03-27 DIAGNOSIS — M545 Low back pain, unspecified: Secondary | ICD-10-CM | POA: Insufficient documentation

## 2022-03-27 DIAGNOSIS — R2689 Other abnormalities of gait and mobility: Secondary | ICD-10-CM | POA: Insufficient documentation

## 2022-03-27 DIAGNOSIS — G8929 Other chronic pain: Secondary | ICD-10-CM | POA: Insufficient documentation

## 2022-03-28 NOTE — Therapy (Incomplete)
?OUTPATIENT PHYSICAL THERAPY TREATMENT NOTE/RE-CERT ? ? ?Patient Name: Alexandra Benjamin ?MRN: [id number] ?DOB:09-23-1976, 46 y.o., female ?Today's Date: 03/28/2022 ? ?PCP: Donney Dice, DO ?REFERRING PROVIDER: Donney Dice, DO ? ? ? ? ? ? ? ? ? ? ? ? ? ? ? ? ?Past Medical History:  ?Diagnosis Date  ? Anxiety   ? Exposure to sexually transmitted disease (STD) 09/25/2019  ? Hypertension   ? Thyroid disease   ? ?Past Surgical History:  ?Procedure Laterality Date  ? FINGER FRACTURE SURGERY    ? TUBAL LIGATION    ? ?Patient Active Problem List  ? Diagnosis Date Noted  ? Mood changes 07/11/2021  ? Muscle strain 06/06/2021  ? Pre-diabetes 10/08/2020  ? Chest pain at rest 09/07/2020  ? HTN (hypertension), benign 03/12/2011  ? Obesity 03/12/2011  ? History of Graves' disease 02/12/2007  ? ? ?REFERRING DIAG:  ?M54.41 (ICD-10-CM) - Right-sided low back pain with right-sided sciatica, unspecified chronicity ? ?THERAPY DIAG:  ?No diagnosis found. ? ?PERTINENT HISTORY:  ?None ? ?PRECAUTIONS:  ?None ? ?SUBJECTIVE: *** Patient presents to PT with decreased overall low back pain. She reports HEP compliance.  ? ?PAIN:  ?Are you having pain? Yes ?NPRS scale: ***/10 ?Pain location: lower back ?Pain location: lower back, R LE ?PAIN TYPE: aching and sharp ?Pain description: intermittent  ?Aggravating factors: prolonged standing ?Relieving factors: heat,  ? ? ? ?OBJECTIVE:  ?    ?PATIENT SURVEYS:  ?FOTO 52% - 03/06/2022 ?  ?LE MMT: ?  ?MMT Right ?01/18/2022 Left ?01/18/2022 Right ?02/22/2022 Left ?02/22/2022  ?Hip flexion  4/5 4/5 4/5 4+/5  ?Hip extension        ?Hip abduction 3+/5 3+/5 3+/5 4/5  ?Hip adduction        ?Hip external rotation        ?Hip internal rotation        ?Knee extension 5/5 5/5    ?Knee flexion 5/5 5/5    ?Ankle dorsiflexion         ?Ankle plantarflexion        ?Ankle inversion        ?Ankle eversion        ?Grossly        ?(Blank rows = not tested) ?  ?FUNCTIONAL TESTS:  ?30 seconds chair stand test: 8 reps - no UE support  (03/15/2022) ?  ?TODAY'S TREATMENT  ?[facility] Adult PT Treatment:                                                DATE: 03/29/2022 ?Therapeutic Exercise: ?NuStep lvl 5 UE/LE x 5 min while taking subjective ?Bridge x 15 - 3" hold ?Supine 90/90 alt toe taps 1x10, 1x8 ?Dead bugs 2x5 BIL ?Supine piriformis stretch 2x30" BIL ?LTR x 10  ?Paloff press 2x10 7# ?Pball roll out x10 each fwd/lateral ?Bird dogs ?Ardine Eng pose ?Ardine Eng pose with side stretch ?Cybex hip abduction/extension 25# 2x10 BIL ? ? ?[facility] Adult PT Treatment:                                                DATE: 03/27/2022 ?Therapeutic Exercise: ?NuStep lvl 5 UE/LE x 5 min while taking subjective ?Bridge x 15 - 3" hold ?Supine 90/90 alt  toe taps 1x10, 1x8 ?Dead bugs 2x5 BIL ?Supine piriformis stretch 2x30" BIL ?LTR x 10  ?Paloff press 2x10 7# ?Pball roll out x10 each fwd/lateral ?Cybex hip abduction/extension 25# 2x10 BIL ? ? ?[facility] Adult PT Treatment:                                                DATE: 03/15/2022 ?Therapeutic Exercise: ?NuStep lvl 5 UE/LE x 4 min while taking subjective ?Bridge x 15 - 3" hold ?STS x 10 ?Abdominal curl up 2x10  ?Supine 90/90 alt toe taps 2x10 ?LTR x 10  ?S/L clamshell 2x10 RTB ?Paloff press 2x10 7# ?Physioball PPT x 15 - 5" ? ? ? ?PATIENT EDUCATION:  ?Education details: eval findings, FOTO, HEP, POC ?Person educated: Patient ?Education method: Explanation, Demonstration, and Handouts ?Education comprehension: verbalized understanding and returned demonstration ?  ?  ?HOME EXERCISE PROGRAM: ?Access Code: [email or web address] ?URL: [email or web address] ?Date: 02/01/2022 ?Prepared by: Octavio Manns ? ?Exercises ?Supine Sciatic Nerve Glide - 1-2 x daily - 7 x weekly - 2 sets - 10 reps ?Supine Lower Trunk Rotation - 1-2 x daily - 7 x weekly - 2 sets - 10 reps ?Supine Piriformis Stretch with Leg Straight - 1-2 x daily - 7 x weekly - 2 reps - 30 sec hold ?Hooklying Clamshell with Resistance - 1-2 x daily - 7 x weekly - 3 sets - 10  reps ?Supine Posterior Pelvic Tilt - 1-2 x daily - 7 x weekly - 2 sets - 10 reps - 5 sec hold ?Active Straight Leg Raise with Quad Set - 1 x daily - 7 x weekly - 2 sets - 10 reps ?  ?  ?ASSESSMENT: ?  ?CLINICAL IMPRESSION: ?*** ? ?Patient presents to PT with lower levels of back pain and reports HEP compliance. Session today focused on proximal hip and core strengthening. Patient was able to tolerate all prescribed exercises with no adverse effects. Patient continues to benefit from skilled PT services and should be progressed as able to improve functional independence. ? ?  ?OBJECTIVE IMPAIRMENTS decreased balance, decreased endurance, decreased mobility, difficulty walking, decreased ROM, decreased strength, and pain.  ?  ?ACTIVITY LIMITATIONS community activity, occupation, and yard work.  ?  ?PERSONAL FACTORS Fitness, Time since onset of injury/illness/exacerbation, and 1-2 comorbidities: HTN, Graves disease   are also affecting patient's functional outcome.  ?  ?  ?GOALS: ?Goals reviewed with patient? No ?  ?SHORT TERM GOALS: ?  ?STG Name Target Date Goal status  ?1 Pt will be compliant and knowledgeable with initial HEP for improved comfort and carryover ?Baseline: initial HEP given 02/08/2022 MET  ?2 Pt will self report lower back and R LE pain no greater than 6/10 for improved comfort and functional ability ?Baseline: 10/10 at worst 02/08/2022 MET  ?  ?LONG TERM GOALS:  ?  ?LTG Name Target Date Goal status  ?1 Pt will self report lower back and R LE pain no greater than 2/10 for improved comfort and functional ability ?Baseline: 10/10 at worst 04/26/2022 ONGOING  ?2 Pt will improve FOTO function score to no less than 63% as proxy for functional improvement ?Baseline: 47% function ?03/06/2022: 51% function 04/26/2022 ONGOING  ?3 Pt will increase 30 Second Sit to Stand rep count to no less than 10 reps for improved balance, strength, and functional mobility ?Baseline: 8 reps  ?03/15/2022: 8 reps -  no UE support  04/26/2022 ONGOING  ?4 Pt will improve bilateral hip abductor strength to no less than 4/5 for improved functional mobility and decreased pain ?Baseline: see chart 04/26/2022 ONGOING  ?5 Pt will be able to stand for >45 min without increase in LBP for improved functional ability with work and ADLs ?Baseline: unable 04/26/2022 ONGOING  ?  ?PLAN: ?PT FREQUENCY: 2x/week ?  ?PT DURATION: 6 weeks ?  ?PLANNED INTERVENTIONS: Therapeutic exercises, Therapeutic activity, Neuromuscular re-education, Balance training, Gait training, Patient/Family education, Joint mobilization, Aquatic Therapy, Dry Needling, Electrical stimulation, Cryotherapy, Moist heat, and Manual therapy ?  ?PLAN FOR NEXT SESSION: assess HEP response,  ? ? ? ?Evelene Croon, PTA ?03/28/2022, 1:31 PM ? ?   ?

## 2022-03-29 ENCOUNTER — Ambulatory Visit: Payer: 59

## 2022-04-03 ENCOUNTER — Ambulatory Visit: Payer: 59

## 2022-04-03 DIAGNOSIS — G8929 Other chronic pain: Secondary | ICD-10-CM

## 2022-04-03 DIAGNOSIS — M6281 Muscle weakness (generalized): Secondary | ICD-10-CM | POA: Diagnosis not present

## 2022-04-03 DIAGNOSIS — R2689 Other abnormalities of gait and mobility: Secondary | ICD-10-CM | POA: Diagnosis not present

## 2022-04-03 DIAGNOSIS — M545 Low back pain, unspecified: Secondary | ICD-10-CM | POA: Diagnosis not present

## 2022-04-03 NOTE — Therapy (Signed)
?OUTPATIENT PHYSICAL THERAPY TREATMENT NOTE/RE-CERT ? ? ?Patient Name: Alexandra Benjamin ?MRN: [id number] ?DOB:[birth year], 46 y.o., female ?Today's Date: 04/03/2022 ? ?PCP: Donney Dice, DO ?REFERRING PROVIDER: Donney Dice, DO ? ? PT End of Session - 04/03/22 1450   ? ? Visit Number 15   ? Number of Visits 25   ? Date for PT Re-Evaluation 04/26/22   ? Authorization Type [facility]   ? PT Start Time 1448   ? PT Stop Time 0355   ? PT Time Calculation (min) 44 min   ? Activity Tolerance Patient tolerated treatment well   ? Behavior During Therapy [facility] for tasks assessed/performed   ? ?  ?  ? ?  ? ? ? ? ? ? ? ? ? ? ? ? ? ? ? ?Past Medical History:  ?Diagnosis Date  ? Anxiety   ? Exposure to sexually transmitted disease (STD) 09/25/2019  ? Hypertension   ? Thyroid disease   ? ?Past Surgical History:  ?Procedure Laterality Date  ? FINGER FRACTURE SURGERY    ? TUBAL LIGATION    ? ?Patient Active Problem List  ? Diagnosis Date Noted  ? Mood changes 07/11/2021  ? Muscle strain 06/06/2021  ? Pre-diabetes 10/08/2020  ? Chest pain at rest 09/07/2020  ? HTN (hypertension), benign 03/12/2011  ? Obesity 03/12/2011  ? History of Graves' disease 02/12/2007  ? ? ?REFERRING DIAG:  ?M54.41 (ICD-10-CM) - Right-sided low back pain with right-sided sciatica, unspecified chronicity ? ?THERAPY DIAG:  ?Chronic low back pain, unspecified back pain laterality, unspecified whether sciatica present ? ?Muscle weakness (generalized) ? ?PERTINENT HISTORY:  ?None ? ?PRECAUTIONS:  ?None ? ?SUBJECTIVE:  ?Pt presents to PT with continued reports of lower back pain. Has remained compliant with HEP with no adverse effect. Pt is ready to begin PT at this time.  ? ?PAIN:  ?Are you having pain? Yes ?NPRS scale: 3/10 ?Pain location: lower back ?Pain location: lower back, R LE ?PAIN TYPE: aching and sharp ?Pain description: intermittent  ?Aggravating factors: prolonged standing ?Relieving factors: heat, rest ? ? ? ?OBJECTIVE:  ?    ?PATIENT SURVEYS:  ?FOTO 52% -  03/06/2022 ?  ?LE MMT: ?  ?MMT Right ?01/18/2022 Left ?01/18/2022 Right ?02/22/2022 Left ?02/22/2022  ?Hip flexion  4/5 4/5 4/5 4+/5  ?Hip extension        ?Hip abduction 3+/5 3+/5 3+/5 4/5  ?Hip adduction        ?Hip external rotation        ?Hip internal rotation        ?Knee extension 5/5 5/5    ?Knee flexion 5/5 5/5    ?Ankle dorsiflexion         ?Ankle plantarflexion        ?Ankle inversion        ?Ankle eversion        ?Grossly        ?(Blank rows = not tested) ?  ?FUNCTIONAL TESTS:  ?30 seconds chair stand test: 8 reps - no UE support (03/15/2022) ?  ?TODAY'S TREATMENT  ?[facility] Adult PT Treatment:                                                DATE: 04/03/2022 ?Therapeutic Exercise: ?NuStep lvl 5 UE/LE x 3 min while taking subjective ?Bridge x 15 - 3" hold ?Dead bugs 2x5 BIL -  with physioball ?Supine piriformis stretch 2x30" BIL ?STS 2x10 10# KB ?Paloff press x 10 10# ?Standing chop 2x10 7# ?Pball roll out x10 each fwd/lateral ?Cybex hip abduction/extension 30# 2x10 BIL ? ?[facility] Adult PT Treatment:                                                DATE: 03/27/2022 ?Therapeutic Exercise: ?NuStep lvl 5 UE/LE x 5 min while taking subjective ?Bridge x 15 - 3" hold ?Supine 90/90 alt toe taps 1x10, 1x8 ?Dead bugs 2x5 BIL ?Supine piriformis stretch 2x30" BIL ?LTR x 10  ?Paloff press 2x10 7# ?Pball roll out x10 each fwd/lateral ?Cybex hip abduction/extension 25# 2x10 BIL ? ? ?[facility] Adult PT Treatment:                                                DATE: 03/15/2022 ?Therapeutic Exercise: ?NuStep lvl 5 UE/LE x 4 min while taking subjective ?Bridge x 15 - 3" hold ?STS x 10 ?Abdominal curl up 2x10  ?Supine 90/90 alt toe taps 2x10 ?LTR x 10  ?S/L clamshell 2x10 RTB ?Paloff press 2x10 7# ?Physioball PPT x 15 - 5" ? ?PATIENT EDUCATION:  ?Education details: eval findings, FOTO, HEP, POC ?Person educated: Patient ?Education method: Explanation, Demonstration, and Handouts ?Education comprehension: verbalized understanding and returned  demonstration ?  ?  ?HOME EXERCISE PROGRAM: ?Access Code: [email or web address] ?URL: [email or web address] ?Date: 02/01/2022 ?Prepared by: Octavio Manns ? ?Exercises ?Supine Sciatic Nerve Glide - 1-2 x daily - 7 x weekly - 2 sets - 10 reps ?Supine Lower Trunk Rotation - 1-2 x daily - 7 x weekly - 2 sets - 10 reps ?Supine Piriformis Stretch with Leg Straight - 1-2 x daily - 7 x weekly - 2 reps - 30 sec hold ?Hooklying Clamshell with Resistance - 1-2 x daily - 7 x weekly - 3 sets - 10 reps ?Supine Posterior Pelvic Tilt - 1-2 x daily - 7 x weekly - 2 sets - 10 reps - 5 sec hold ?Active Straight Leg Raise with Quad Set - 1 x daily - 7 x weekly - 2 sets - 10 reps ?  ?  ?ASSESSMENT: ?  ?CLINICAL IMPRESSION: ?Pt was able to complete all prescribed exercises with no adverse effect or increase in pain. Therapy focused again on improving core and proximal hip strength for decreasing pain and improving functional mobility. She continues to progress well with therapy and will continue to be seen and progressed as able.  ? ?  ?OBJECTIVE IMPAIRMENTS decreased balance, decreased endurance, decreased mobility, difficulty walking, decreased ROM, decreased strength, and pain.  ?  ?ACTIVITY LIMITATIONS community activity, occupation, and yard work.  ?  ?PERSONAL FACTORS Fitness, Time since onset of injury/illness/exacerbation, and 1-2 comorbidities: HTN, Graves disease   are also affecting patient's functional outcome.  ?  ?  ?GOALS: ?Goals reviewed with patient? No ?  ?SHORT TERM GOALS: ?  ?STG Name Target Date Goal status  ?1 Pt will be compliant and knowledgeable with initial HEP for improved comfort and carryover ?Baseline: initial HEP given 02/08/2022 MET  ?2 Pt will self report lower back and R LE pain no greater than 6/10 for improved comfort and functional ability ?Baseline: 10/10 at worst 02/08/2022 MET  ?  ?  LONG TERM GOALS:  ?  ?LTG Name Target Date Goal status  ?1 Pt will self report lower back and R LE pain no greater than  2/10 for improved comfort and functional ability ?Baseline: 10/10 at worst 04/26/2022 ONGOING  ?2 Pt will improve FOTO function score to no less than 63% as proxy for functional improvement ?Baseline: 47% function ?03/06/2022: 51% function 04/26/2022 ONGOING  ?3 Pt will increase 30 Second Sit to Stand rep count to no less than 10 reps for improved balance, strength, and functional mobility ?Baseline: 8 reps  ?03/15/2022: 8 reps - no UE support 04/26/2022 ONGOING  ?4 Pt will improve bilateral hip abductor strength to no less than 4/5 for improved functional mobility and decreased pain ?Baseline: see chart 04/26/2022 ONGOING  ?5 Pt will be able to stand for >45 min without increase in LBP for improved functional ability with work and ADLs ?Baseline: unable 04/26/2022 ONGOING  ?  ?PLAN: ?PT FREQUENCY: 2x/week ?  ?PT DURATION: 6 weeks ?  ?PLANNED INTERVENTIONS: Therapeutic exercises, Therapeutic activity, Neuromuscular re-education, Balance training, Gait training, Patient/Family education, Joint mobilization, Aquatic Therapy, Dry Needling, Electrical stimulation, Cryotherapy, Moist heat, and Manual therapy ?  ?PLAN FOR NEXT SESSION: assess HEP response,  ? ? ? ?Ward Chatters, PT ?04/03/2022, 3:33 PM ? ?   ?

## 2022-04-05 ENCOUNTER — Ambulatory Visit: Payer: 59

## 2022-04-05 DIAGNOSIS — M6281 Muscle weakness (generalized): Secondary | ICD-10-CM

## 2022-04-05 DIAGNOSIS — G8929 Other chronic pain: Secondary | ICD-10-CM | POA: Diagnosis not present

## 2022-04-05 DIAGNOSIS — M545 Low back pain, unspecified: Secondary | ICD-10-CM

## 2022-04-05 DIAGNOSIS — R2689 Other abnormalities of gait and mobility: Secondary | ICD-10-CM | POA: Diagnosis not present

## 2022-04-05 NOTE — Therapy (Signed)
OUTPATIENT PHYSICAL THERAPY TREATMENT NOTE/RE-CERT   Patient Name: Alexandra Benjamin MRN: [id number] DOB:1975/12/03, 46 y.o., female Today's Date: 04/05/2022  PCP: Donney Dice, DO REFERRING PROVIDER: Zenia Resides, MD   PT End of Session - 04/05/22 1220     Visit Number 16    Number of Visits 25    Date for PT Re-Evaluation 04/26/22    Authorization Type [facility]    PT Start Time 1218    PT Stop Time 2025    PT Time Calculation (min) 40 min    Activity Tolerance Patient tolerated treatment well    Behavior During Therapy [facility] for tasks assessed/performed                           Past Medical History:  Diagnosis Date   Anxiety    Exposure to sexually transmitted disease (STD) 09/25/2019   Hypertension    Thyroid disease    Past Surgical History:  Procedure Laterality Date   FINGER FRACTURE SURGERY     TUBAL LIGATION     Patient Active Problem List   Diagnosis Date Noted   Mood changes 07/11/2021   Muscle strain 06/06/2021   Pre-diabetes 10/08/2020   Chest pain at rest 09/07/2020   HTN (hypertension), benign 03/12/2011   Obesity 03/12/2011   History of Graves' disease 02/12/2007    REFERRING DIAG:  M54.41 (ICD-10-CM) - Right-sided low back pain with right-sided sciatica, unspecified chronicity  THERAPY DIAG:  Chronic low back pain, unspecified back pain laterality, unspecified whether sciatica present  Muscle weakness (generalized)  PERTINENT HISTORY:  None  PRECAUTIONS:  None  SUBJECTIVE:  Pt presents to PT with with reports of continued lower back discomfort. She has been compliant with HEP with no adverse effect. Pt is ready to begin PT at this time.   PAIN:  Are you having pain?  Yes NPRS scale: 4/10 Pain location: lower back Pain location: lower back, R LE PAIN TYPE: aching and sharp Pain description: intermittent  Aggravating factors: prolonged standing Relieving factors: heat, rest    OBJECTIVE:      PATIENT  SURVEYS:  FOTO 52% - 03/06/2022   LE MMT:   MMT Right 01/18/2022 Left 01/18/2022 Right 02/22/2022 Left 02/22/2022  Hip flexion  4/5 4/5 4/5 4+/5  Hip extension        Hip abduction 3+/5 3+/5 3+/5 4/5  Hip adduction        Hip external rotation        Hip internal rotation        Knee extension 5/5 5/5    Knee flexion 5/5 5/5    Ankle dorsiflexion         Ankle plantarflexion        Ankle inversion        Ankle eversion        Grossly        (Blank rows = not tested)   FUNCTIONAL TESTS:  30 seconds chair stand test: 8 reps - no UE support (03/15/2022)   TODAY'S TREATMENT  [facility] Adult PT Treatment:                                                DATE: 04/05/2022 Therapeutic Exercise: NuStep lvl 6 UE/LE x 4 min while taking subjective Dead bugs 2x5 BIL -  with physioball STS 3x10 10# KB Modified side planks 3x10" each Paloff press 2x10 10# Standing chop 2x10 7# Seated pball PPT x 10 - 5" hold Pball roll out x10 each fwd/lateral Cybex hip abduction 30# 2x10 BIL  [facility] Adult PT Treatment:                                                DATE: 04/03/2022 Therapeutic Exercise: NuStep lvl 5 UE/LE x 3 min while taking subjective Bridge x 15 - 3" hold Dead bugs 2x5 BIL - with physioball Supine piriformis stretch 2x30" BIL STS 2x10 10# KB Paloff press x 10 10# Standing chop 2x10 7# Pball roll out x10 each fwd/lateral Cybex hip abduction/extension 30# 2x10 BIL  [facility] Adult PT Treatment:                                                DATE: 03/27/2022 Therapeutic Exercise: NuStep lvl 5 UE/LE x 5 min while taking subjective Bridge x 15 - 3" hold Supine 90/90 alt toe taps 1x10, 1x8 Dead bugs 2x5 BIL Supine piriformis stretch 2x30" BIL LTR x 10  Paloff press 2x10 7# Pball roll out x10 each fwd/lateral Cybex hip abduction/extension 25# 2x10 BIL  PATIENT EDUCATION:  Education details: eval findings, FOTO, HEP, POC Person educated: Patient Education method: Explanation, Demonstration, and  Handouts Education comprehension: verbalized understanding and returned demonstration     HOME EXERCISE PROGRAM: Access Code: [email or web address] URL: [email or web address] Date: 02/01/2022 Prepared by: Octavio Manns  Exercises Supine Sciatic Nerve Glide - 1-2 x daily - 7 x weekly - 2 sets - 10 reps Supine Lower Trunk Rotation - 1-2 x daily - 7 x weekly - 2 sets - 10 reps Supine Piriformis Stretch with Leg Straight - 1-2 x daily - 7 x weekly - 2 reps - 30 sec hold Hooklying Clamshell with Resistance - 1-2 x daily - 7 x weekly - 3 sets - 10 reps Supine Posterior Pelvic Tilt - 1-2 x daily - 7 x weekly - 2 sets - 10 reps - 5 sec hold Active Straight Leg Raise with Quad Set - 1 x daily - 7 x weekly - 2 sets - 10 reps     ASSESSMENT:   CLINICAL IMPRESSION: Pt was able to complete all prescribed exercises with no adverse effect or increase in pain. Therapy today continued to progress core and proximal hip strengthening exercises which pt tolerated well. She continues to benefit from skilled PT services and will continue to be seen and progressed as able per POC.     OBJECTIVE IMPAIRMENTS decreased balance, decreased endurance, decreased mobility, difficulty walking, decreased ROM, decreased strength, and pain.    ACTIVITY LIMITATIONS community activity, occupation, and yard work.    PERSONAL FACTORS Fitness, Time since onset of injury/illness/exacerbation, and 1-2 comorbidities: HTN, Graves disease   are also affecting patient's functional outcome.      GOALS: Goals reviewed with patient? No   SHORT TERM GOALS:   STG Name Target Date Goal status  1 Pt will be compliant and knowledgeable with initial HEP for improved comfort and carryover Baseline: initial HEP given 02/08/2022 MET  2 Pt will self report lower back and R LE pain no greater than 6/10  for improved comfort and functional ability Baseline: 10/10 at worst 02/08/2022 MET    LONG TERM GOALS:    LTG Name Target Date Goal  status  1 Pt will self report lower back and R LE pain no greater than 2/10 for improved comfort and functional ability Baseline: 10/10 at worst 04/26/2022 ONGOING  2 Pt will improve FOTO function score to no less than 63% as proxy for functional improvement Baseline: 47% function 03/06/2022: 51% function 04/26/2022 ONGOING  3 Pt will increase 30 Second Sit to Stand rep count to no less than 10 reps for improved balance, strength, and functional mobility Baseline: 8 reps  03/15/2022: 8 reps - no UE support 04/26/2022 ONGOING  4 Pt will improve bilateral hip abductor strength to no less than 4/5 for improved functional mobility and decreased pain Baseline: see chart 04/26/2022 ONGOING  5 Pt will be able to stand for >45 min without increase in LBP for improved functional ability with work and ADLs Baseline: unable 04/26/2022 ONGOING    PLAN: PT FREQUENCY: 2x/week   PT DURATION: 6 weeks   PLANNED INTERVENTIONS: Therapeutic exercises, Therapeutic activity, Neuromuscular re-education, Balance training, Gait training, Patient/Family education, Joint mobilization, Aquatic Therapy, Dry Needling, Electrical stimulation, Cryotherapy, Moist heat, and Manual therapy   PLAN FOR NEXT SESSION: assess HEP response,     Ward Chatters, PT 04/05/2022, 12:58 PM

## 2022-04-10 ENCOUNTER — Ambulatory Visit: Payer: 59

## 2022-04-10 DIAGNOSIS — R2689 Other abnormalities of gait and mobility: Secondary | ICD-10-CM | POA: Diagnosis not present

## 2022-04-10 DIAGNOSIS — M6281 Muscle weakness (generalized): Secondary | ICD-10-CM | POA: Diagnosis not present

## 2022-04-10 DIAGNOSIS — G8929 Other chronic pain: Secondary | ICD-10-CM | POA: Diagnosis not present

## 2022-04-10 DIAGNOSIS — M545 Low back pain, unspecified: Secondary | ICD-10-CM | POA: Diagnosis not present

## 2022-04-10 NOTE — Therapy (Signed)
OUTPATIENT PHYSICAL THERAPY TREATMENT NOTE/RE-CERT   Patient Name: Alexandra Benjamin MRN: [id number] DOB:04-Sep-1976, 46 y.o., female Today's Date: 2022/05/09  PCP: Donney Dice, DO REFERRING PROVIDER: Zenia Resides, MD   PT End of Session - May 09, 2022 1456     Visit Number 17    Number of Visits 25    Date for PT Re-Evaluation 04/26/22    Authorization Type [facility]    PT Start Time 1455    PT Stop Time 2353    PT Time Calculation (min) 35 min    Activity Tolerance Patient tolerated treatment well    Behavior During Therapy [facility] for tasks assessed/performed                            Past Medical History:  Diagnosis Date   Anxiety    Exposure to sexually transmitted disease (STD) 09/25/2019   Hypertension    Thyroid disease    Past Surgical History:  Procedure Laterality Date   FINGER FRACTURE SURGERY     TUBAL LIGATION     Patient Active Problem List   Diagnosis Date Noted   Mood changes 07/11/2021   Muscle strain 06/06/2021   Pre-diabetes 10/08/2020   Chest pain at rest 09/07/2020   HTN (hypertension), benign 03/12/2011   Obesity 03/12/2011   History of Graves' disease 02/12/2007    REFERRING DIAG:  M54.41 (ICD-10-CM) - Right-sided low back pain with right-sided sciatica, unspecified chronicity  THERAPY DIAG:  Chronic low back pain, unspecified back pain laterality, unspecified whether sciatica present  Muscle weakness (generalized)  PERTINENT HISTORY:  None  PRECAUTIONS:  None  SUBJECTIVE:  Pt arrives 10 min late to therapy today. Denies pain, has continued HEP compliance. She is ready to begin PT at this time.  PAIN:  Are you having pain?  No NPRS scale: 0/10 Pain location: lower back Pain location: lower back, R LE PAIN TYPE: aching and sharp Pain description: intermittent  Aggravating factors: prolonged standing Relieving factors: heat, rest    OBJECTIVE:      PATIENT SURVEYS:  FOTO 52% - 03/06/2022   LE MMT:    MMT Right 01/18/2022 Left 01/18/2022 Right 02/22/2022 Left 02/22/2022  Hip flexion  4/5 4/5 4/5 4+/5  Hip extension        Hip abduction 3+/5 3+/5 3+/5 4/5  Hip adduction        Hip external rotation        Hip internal rotation        Knee extension 5/5 5/5    Knee flexion 5/5 5/5    Ankle dorsiflexion         Ankle plantarflexion        Ankle inversion        Ankle eversion        Grossly        (Blank rows = not tested)   FUNCTIONAL TESTS:  30 seconds chair stand test: 8 reps - no UE support (03/15/2022)   TODAY'S TREATMENT  [facility] Adult PT Treatment:                                                DATE: 05/09/22 Therapeutic Exercise: Dead bugs 2x5 BIL - with physioball Paloff press 2x10 10# Standing chop x 10 10# Standing lift x 10 7# FM deadlift  2x10 20# Cybex hip abduction 30# 2x10 BIL  [facility] Adult PT Treatment:                                                DATE: 04/05/2022 Therapeutic Exercise: NuStep lvl 6 UE/LE x 4 min while taking subjective Dead bugs 2x5 BIL - with physioball STS 3x10 10# KB Modified side planks 3x10" each Paloff press 2x10 10# Standing chop 2x10 7# Seated pball PPT x 10 - 5" hold Pball roll out x10 each fwd/lateral Cybex hip abduction 30# 2x10 BIL  [facility] Adult PT Treatment:                                                DATE: 04/03/2022 Therapeutic Exercise: NuStep lvl 5 UE/LE x 3 min while taking subjective Bridge x 15 - 3" hold Dead bugs 2x5 BIL - with physioball Supine piriformis stretch 2x30" BIL STS 2x10 10# KB Paloff press x 10 10# Standing chop 2x10 7# Pball roll out x10 each fwd/lateral Cybex hip abduction/extension 30# 2x10 BIL  PATIENT EDUCATION:  Education details: eval findings, FOTO, HEP, POC Person educated: Patient Education method: Explanation, Demonstration, and Handouts Education comprehension: verbalized understanding and returned demonstration     HOME EXERCISE PROGRAM: Access Code: [email or web address] URL:  [email or web address] Date: 02/01/2022 Prepared by: Octavio Manns  Exercises Supine Sciatic Nerve Glide - 1-2 x daily - 7 x weekly - 2 sets - 10 reps Supine Lower Trunk Rotation - 1-2 x daily - 7 x weekly - 2 sets - 10 reps Supine Piriformis Stretch with Leg Straight - 1-2 x daily - 7 x weekly - 2 reps - 30 sec hold Hooklying Clamshell with Resistance - 1-2 x daily - 7 x weekly - 3 sets - 10 reps Supine Posterior Pelvic Tilt - 1-2 x daily - 7 x weekly - 2 sets - 10 reps - 5 sec hold Active Straight Leg Raise with Quad Set - 1 x daily - 7 x weekly - 2 sets - 10 reps     ASSESSMENT:   CLINICAL IMPRESSION: Pt was able to complete all prescribed exercises with no adverse effect or increase in pain. Therapy focused on improving core and proximal hip strength. She continues to progress well with therapy and will continue to be seen and progressed as able per POC.    OBJECTIVE IMPAIRMENTS decreased balance, decreased endurance, decreased mobility, difficulty walking, decreased ROM, decreased strength, and pain.    ACTIVITY LIMITATIONS community activity, occupation, and yard work.    PERSONAL FACTORS Fitness, Time since onset of injury/illness/exacerbation, and 1-2 comorbidities: HTN, Graves disease   are also affecting patient's functional outcome.      GOALS: Goals reviewed with patient? No   SHORT TERM GOALS:   STG Name Target Date Goal status  1 Pt will be compliant and knowledgeable with initial HEP for improved comfort and carryover Baseline: initial HEP given 02/08/2022 MET  2 Pt will self report lower back and R LE pain no greater than 6/10 for improved comfort and functional ability Baseline: 10/10 at worst 02/08/2022 MET    LONG TERM GOALS:    LTG Name Target Date Goal status  1 Pt will self report lower back and R  LE pain no greater than 2/10 for improved comfort and functional ability Baseline: 10/10 at worst 04/26/2022 ONGOING  2 Pt will improve FOTO function  score to no less than 63% as proxy for functional improvement Baseline: 47% function 03/06/2022: 51% function 04/26/2022 ONGOING  3 Pt will increase 30 Second Sit to Stand rep count to no less than 10 reps for improved balance, strength, and functional mobility Baseline: 8 reps  03/15/2022: 8 reps - no UE support 04/26/2022 ONGOING  4 Pt will improve bilateral hip abductor strength to no less than 4/5 for improved functional mobility and decreased pain Baseline: see chart 04/26/2022 ONGOING  5 Pt will be able to stand for >45 min without increase in LBP for improved functional ability with work and ADLs Baseline: unable 04/26/2022 ONGOING    PLAN: PT FREQUENCY: 2x/week   PT DURATION: 6 weeks   PLANNED INTERVENTIONS: Therapeutic exercises, Therapeutic activity, Neuromuscular re-education, Balance training, Gait training, Patient/Family education, Joint mobilization, Aquatic Therapy, Dry Needling, Electrical stimulation, Cryotherapy, Moist heat, and Manual therapy   PLAN FOR NEXT SESSION: assess HEP response,     Ward Chatters, PT 04/10/2022, 3:36 PM

## 2022-04-11 NOTE — Therapy (Signed)
OUTPATIENT PHYSICAL THERAPY TREATMENT NOTE/RE-CERT   Patient Name: Alexandra Benjamin MRN: [id number] DOB:11/18/1976, 46 y.o., female Today's Date: 04/12/2022  PCP: Donney Dice, DO REFERRING PROVIDER: Donney Dice, DO   PT End of Session - 04/12/22 1136     Visit Number 18    Number of Visits 25    Date for PT Re-Evaluation 04/26/22    Authorization Type [facility]    PT Start Time 4098    PT Stop Time 1191    PT Time Calculation (min) 39 min    Activity Tolerance Patient tolerated treatment well    Behavior During Therapy [facility] for tasks assessed/performed                             Past Medical History:  Diagnosis Date   Anxiety    Exposure to sexually transmitted disease (STD) 09/25/2019   Hypertension    Thyroid disease    Past Surgical History:  Procedure Laterality Date   FINGER FRACTURE SURGERY     TUBAL LIGATION     Patient Active Problem List   Diagnosis Date Noted   Mood changes 07/11/2021   Muscle strain 06/06/2021   Pre-diabetes 10/08/2020   Chest pain at rest 09/07/2020   HTN (hypertension), benign 03/12/2011   Obesity 03/12/2011   History of Graves' disease 02/12/2007    REFERRING DIAG:  M54.41 (ICD-10-CM) - Right-sided low back pain with right-sided sciatica, unspecified chronicity  THERAPY DIAG:  Chronic low back pain, unspecified back pain laterality, unspecified whether sciatica present  Muscle weakness (generalized)  Other abnormalities of gait and mobility  PERTINENT HISTORY:  None  PRECAUTIONS:  None  SUBJECTIVE:  Pt presents to PT with reports of decreased R LE pain, but notes some R sided lower back pain. She continues HEP compliance with no adverse effect. Pt is ready to begin PT at this time.  PAIN:  Are you having pain?  Yes: NPRS scale: 5/10 Pain location: lower back PAIN TYPE: aching and sharp Pain description: intermittent  Aggravating factors: prolonged standing Relieving factors: heat,  rest    OBJECTIVE:      PATIENT SURVEYS:  FOTO 52% - 03/06/2022   LE MMT:   MMT Right 01/18/2022 Left 01/18/2022 Right 02/22/2022 Left 02/22/2022  Hip flexion  4/5 4/5 4/5 4+/5  Hip extension        Hip abduction 3+/5 3+/5 3+/5 4/5  Hip adduction        Hip external rotation        Hip internal rotation        Knee extension 5/5 5/5    Knee flexion 5/5 5/5    Ankle dorsiflexion         Ankle plantarflexion        Ankle inversion        Ankle eversion        Grossly        (Blank rows = not tested)   FUNCTIONAL TESTS:  30 seconds chair stand test: 8 reps - no UE support (03/15/2022)   TODAY'S TREATMENT  [facility] Adult PT Treatment:                                                DATE: 04/12/2022 Therapeutic Exercise: NuStep lvl 5 UE/LE x 3 min while taking  subjective Row 2x10 20# FM deadlift 2x10 20# Paloff press 2x10 10# Standing chop x 10 10# Dead bugs 2x5 BIL - with physioball Bird dog 2x5 Cybex hip abduction 30# 2x10 BIL  [facility] Adult PT Treatment:                                                DATE: 04-25-22 Therapeutic Exercise: Dead bugs 2x5 BIL - with physioball Paloff press 2x10 10# Standing chop x 10 10# Standing lift x 10 7# FM deadlift 2x10 20# Cybex hip abduction 30# 2x10 BIL  [facility] Adult PT Treatment:                                                DATE: 04/05/2022 Therapeutic Exercise: NuStep lvl 6 UE/LE x 4 min while taking subjective Dead bugs 2x5 BIL - with physioball STS 3x10 10# KB Modified side planks 3x10" each Paloff press 2x10 10# Standing chop 2x10 7# Seated pball PPT x 10 - 5" hold Pball roll out x10 each fwd/lateral Cybex hip abduction 30# 2x10 BIL  PATIENT EDUCATION:  Education details: eval findings, FOTO, HEP, POC Person educated: Patient Education method: Explanation, Demonstration, and Handouts Education comprehension: verbalized understanding and returned demonstration     HOME EXERCISE PROGRAM: Access Code: [email or web address] URL:  [email or web address] Date: 02/01/2022 Prepared by: Octavio Manns  Exercises Supine Sciatic Nerve Glide - 1-2 x daily - 7 x weekly - 2 sets - 10 reps Supine Lower Trunk Rotation - 1-2 x daily - 7 x weekly - 2 sets - 10 reps Supine Piriformis Stretch with Leg Straight - 1-2 x daily - 7 x weekly - 2 reps - 30 sec hold Hooklying Clamshell with Resistance - 1-2 x daily - 7 x weekly - 3 sets - 10 reps Supine Posterior Pelvic Tilt - 1-2 x daily - 7 x weekly - 2 sets - 10 reps - 5 sec hold Active Straight Leg Raise with Quad Set - 1 x daily - 7 x weekly - 2 sets - 10 reps     ASSESSMENT:   CLINICAL IMPRESSION: Pt was able to complete all prescribed exercises with no adverse effect or increase in pain. Therapy today focused on continuing to increase core and proximal hip strength, with pt continuing to progress well. Pt continues to benefit from skilled PT and will continue to be seen and progressed as able.     OBJECTIVE IMPAIRMENTS decreased balance, decreased endurance, decreased mobility, difficulty walking, decreased ROM, decreased strength, and pain.    ACTIVITY LIMITATIONS community activity, occupation, and yard work.    PERSONAL FACTORS Fitness, Time since onset of injury/illness/exacerbation, and 1-2 comorbidities: HTN, Graves disease   are also affecting patient's functional outcome.      GOALS: Goals reviewed with patient? No   SHORT TERM GOALS:   STG Name Target Date Goal status  1 Pt will be compliant and knowledgeable with initial HEP for improved comfort and carryover Baseline: initial HEP given 02/08/2022 MET  2 Pt will self report lower back and R LE pain no greater than 6/10 for improved comfort and functional ability Baseline: 10/10 at worst 02/08/2022 MET    LONG TERM GOALS:    LTG Name Target Date Goal  status  1 Pt will self report lower back and R LE pain no greater than 2/10 for improved comfort and functional ability Baseline: 10/10 at worst  04/26/2022 ONGOING  2 Pt will improve FOTO function score to no less than 63% as proxy for functional improvement Baseline: 47% function 03/06/2022: 51% function 04/26/2022 ONGOING  3 Pt will increase 30 Second Sit to Stand rep count to no less than 10 reps for improved balance, strength, and functional mobility Baseline: 8 reps  03/15/2022: 8 reps - no UE support 04/26/2022 ONGOING  4 Pt will improve bilateral hip abductor strength to no less than 4/5 for improved functional mobility and decreased pain Baseline: see chart 04/26/2022 ONGOING  5 Pt will be able to stand for >45 min without increase in LBP for improved functional ability with work and ADLs Baseline: unable 04/26/2022 ONGOING    PLAN: PT FREQUENCY: 2x/week   PT DURATION: 6 weeks   PLANNED INTERVENTIONS: Therapeutic exercises, Therapeutic activity, Neuromuscular re-education, Balance training, Gait training, Patient/Family education, Joint mobilization, Aquatic Therapy, Dry Needling, Electrical stimulation, Cryotherapy, Moist heat, and Manual therapy   PLAN FOR NEXT SESSION: assess HEP response,     Ward Chatters, PT 04/12/2022, 12:15 PM

## 2022-04-12 ENCOUNTER — Ambulatory Visit: Payer: 59

## 2022-04-12 DIAGNOSIS — M545 Low back pain, unspecified: Secondary | ICD-10-CM

## 2022-04-12 DIAGNOSIS — G8929 Other chronic pain: Secondary | ICD-10-CM | POA: Diagnosis not present

## 2022-04-12 DIAGNOSIS — R2689 Other abnormalities of gait and mobility: Secondary | ICD-10-CM

## 2022-04-12 DIAGNOSIS — M6281 Muscle weakness (generalized): Secondary | ICD-10-CM

## 2022-04-17 ENCOUNTER — Ambulatory Visit: Payer: 59

## 2022-04-17 DIAGNOSIS — M6281 Muscle weakness (generalized): Secondary | ICD-10-CM

## 2022-04-17 DIAGNOSIS — M545 Low back pain, unspecified: Secondary | ICD-10-CM | POA: Diagnosis not present

## 2022-04-17 DIAGNOSIS — R2689 Other abnormalities of gait and mobility: Secondary | ICD-10-CM

## 2022-04-17 DIAGNOSIS — G8929 Other chronic pain: Secondary | ICD-10-CM | POA: Diagnosis not present

## 2022-04-17 NOTE — Therapy (Signed)
OUTPATIENT PHYSICAL THERAPY TREATMENT NOTE   Patient Name: Alexandra Benjamin MRN: [id number] DOB:Jul 08, 1976, 46 y.o., female Today's Date: 04/17/2022  PCP: Donney Dice, DO REFERRING PROVIDER: Zenia Resides, MD   PT End of Session - 04/17/22 1451     Visit Number 19    Number of Visits 25    Date for PT Re-Evaluation 04/26/22    Authorization Type [facility]    PT Start Time 6967    PT Stop Time 1525    PT Time Calculation (min) 40 min    Activity Tolerance Patient tolerated treatment well    Behavior During Therapy [facility] for tasks assessed/performed                              Past Medical History:  Diagnosis Date   Anxiety    Exposure to sexually transmitted disease (STD) 09/25/2019   Hypertension    Thyroid disease    Past Surgical History:  Procedure Laterality Date   FINGER FRACTURE SURGERY     TUBAL LIGATION     Patient Active Problem List   Diagnosis Date Noted   Mood changes 07/11/2021   Muscle strain 06/06/2021   Pre-diabetes 10/08/2020   Chest pain at rest 09/07/2020   HTN (hypertension), benign 03/12/2011   Obesity 03/12/2011   History of Graves' disease 02/12/2007    REFERRING DIAG:  M54.41 (ICD-10-CM) - Right-sided low back pain with right-sided sciatica, unspecified chronicity  THERAPY DIAG:  Chronic low back pain, unspecified back pain laterality, unspecified whether sciatica present  Muscle weakness (generalized)  Other abnormalities of gait and mobility  PERTINENT HISTORY:  None  PRECAUTIONS:  None  SUBJECTIVE:  Pt presents to PT with reports of continued lower back pain. Has been compliant with HEP with no adverse effect. Pt is ready to begin PT at this time.  PAIN:  Are you having pain?  Yes: NPRS scale: 5/10 Pain location: lower back PAIN TYPE: aching and sharp Pain description: intermittent  Aggravating factors: prolonged standing Relieving factors: heat, rest    OBJECTIVE:      PATIENT SURVEYS:   FOTO 52% - 03/06/2022   LE MMT:   MMT Right 01/18/2022 Left 01/18/2022 Right 02/22/2022 Left 02/22/2022  Hip flexion  4/5 4/5 4/5 4+/5  Hip extension        Hip abduction 3+/5 3+/5 3+/5 4/5  Hip adduction        Hip external rotation        Hip internal rotation        Knee extension 5/5 5/5    Knee flexion 5/5 5/5    Ankle dorsiflexion         Ankle plantarflexion        Ankle inversion        Ankle eversion        Grossly        (Blank rows = not tested)   FUNCTIONAL TESTS:  30 seconds chair stand test: 8 reps - no UE support (03/15/2022)   TODAY'S TREATMENT  [facility] Adult PT Treatment:                                                DATE: 04/17/2022 Therapeutic Exercise: NuStep lvl 6 UE/LE x 4 min while taking subjective Row 2x10 20# FM  deadlift 2x10 20# Paloff press 2x10 10# Standing chop x 10 13# Dead bugs 2x5 BIL - with physioball Bird dog 2x5 Side plank from knee 2x15" each STS 2x10 10# KB Cybex hip abduction 37.5# x 10 BIL  [facility] Adult PT Treatment:                                                DATE: 04/12/2022 Therapeutic Exercise: NuStep lvl 5 UE/LE x 3 min while taking subjective Row 2x10 20# FM deadlift 2x10 20# Paloff press 2x10 10# Standing chop x 10 10# Dead bugs 2x5 BIL - with physioball Bird dog 2x5 Cybex hip abduction 30# 2x10 BIL  [facility] Adult PT Treatment:                                                DATE: Apr 26, 2022 Therapeutic Exercise: Dead bugs 2x5 BIL - with physioball Paloff press 2x10 10# Standing chop x 10 10# Standing lift x 10 7# FM deadlift 2x10 20# Cybex hip abduction 30# 2x10 BIL  PATIENT EDUCATION:  Education details: eval findings, FOTO, HEP, POC Person educated: Patient Education method: Explanation, Demonstration, and Handouts Education comprehension: verbalized understanding and returned demonstration     HOME EXERCISE PROGRAM: Access Code: [email or web address] URL: [email or web address] Date: 02/01/2022 Prepared by:  Octavio Manns  Exercises Supine Sciatic Nerve Glide - 1-2 x daily - 7 x weekly - 2 sets - 10 reps Supine Lower Trunk Rotation - 1-2 x daily - 7 x weekly - 2 sets - 10 reps Supine Piriformis Stretch with Leg Straight - 1-2 x daily - 7 x weekly - 2 reps - 30 sec hold Hooklying Clamshell with Resistance - 1-2 x daily - 7 x weekly - 3 sets - 10 reps Supine Posterior Pelvic Tilt - 1-2 x daily - 7 x weekly - 2 sets - 10 reps - 5 sec hold Active Straight Leg Raise with Quad Set - 1 x daily - 7 x weekly - 2 sets - 10 reps     ASSESSMENT:   CLINICAL IMPRESSION: Pt was able to complete all prescribed exercises with no adverse effect. Therapy today continued to progress core and proximal hip strengthening. She shows continued improvement in strength and mobility. Will continue to progress as able per POC.     OBJECTIVE IMPAIRMENTS decreased balance, decreased endurance, decreased mobility, difficulty walking, decreased ROM, decreased strength, and pain.    ACTIVITY LIMITATIONS community activity, occupation, and yard work.    PERSONAL FACTORS Fitness, Time since onset of injury/illness/exacerbation, and 1-2 comorbidities: HTN, Graves disease   are also affecting patient's functional outcome.      GOALS: Goals reviewed with patient? No   SHORT TERM GOALS:   STG Name Target Date Goal status  1 Pt will be compliant and knowledgeable with initial HEP for improved comfort and carryover Baseline: initial HEP given 02/08/2022 MET  2 Pt will self report lower back and R LE pain no greater than 6/10 for improved comfort and functional ability Baseline: 10/10 at worst 02/08/2022 MET    LONG TERM GOALS:    LTG Name Target Date Goal status  1 Pt will self report lower back and R LE pain no greater than 2/10 for improved  comfort and functional ability Baseline: 10/10 at worst 04/26/2022 ONGOING  2 Pt will improve FOTO function score to no less than 63% as proxy for functional improvement Baseline: 47%  function 03/06/2022: 51% function 04/26/2022 ONGOING  3 Pt will increase 30 Second Sit to Stand rep count to no less than 10 reps for improved balance, strength, and functional mobility Baseline: 8 reps  03/15/2022: 8 reps - no UE support 04/26/2022 ONGOING  4 Pt will improve bilateral hip abductor strength to no less than 4/5 for improved functional mobility and decreased pain Baseline: see chart 04/26/2022 ONGOING  5 Pt will be able to stand for >45 min without increase in LBP for improved functional ability with work and ADLs Baseline: unable 04/26/2022 ONGOING    PLAN: PT FREQUENCY: 2x/week   PT DURATION: 6 weeks   PLANNED INTERVENTIONS: Therapeutic exercises, Therapeutic activity, Neuromuscular re-education, Balance training, Gait training, Patient/Family education, Joint mobilization, Aquatic Therapy, Dry Needling, Electrical stimulation, Cryotherapy, Moist heat, and Manual therapy   PLAN FOR NEXT SESSION: assess HEP response,     Ward Chatters, PT 04/17/2022, 3:31 PM

## 2022-04-19 ENCOUNTER — Ambulatory Visit: Payer: 59 | Attending: Family Medicine

## 2022-04-19 DIAGNOSIS — M6281 Muscle weakness (generalized): Secondary | ICD-10-CM | POA: Insufficient documentation

## 2022-04-19 DIAGNOSIS — R2689 Other abnormalities of gait and mobility: Secondary | ICD-10-CM | POA: Diagnosis not present

## 2022-04-19 DIAGNOSIS — G8929 Other chronic pain: Secondary | ICD-10-CM | POA: Insufficient documentation

## 2022-04-19 DIAGNOSIS — M545 Low back pain, unspecified: Secondary | ICD-10-CM | POA: Insufficient documentation

## 2022-04-19 NOTE — Therapy (Signed)
OUTPATIENT PHYSICAL THERAPY TREATMENT NOTE   Patient Name: Alexandra Benjamin MRN: [id number] DOB:05-28-1976, 46 y.o., female Today's Date: 04/19/2022  PCP: Donney Dice, DO REFERRING PROVIDER: Donney Dice, DO   PT End of Session - 04/19/22 1140     Visit Number 20    Number of Visits 25    Date for PT Re-Evaluation 04/26/22    Authorization Type [facility]    PT Start Time 1140    PT Stop Time 1212    PT Time Calculation (min) 32 min    Activity Tolerance Patient tolerated treatment well    Behavior During Therapy WFL for tasks assessed/performed                              Past Medical History:  Diagnosis Date   Anxiety    Exposure to sexually transmitted disease (STD) 09/25/2019   Hypertension    Thyroid disease    Past Surgical History:  Procedure Laterality Date   FINGER FRACTURE SURGERY     TUBAL LIGATION     Patient Active Problem List   Diagnosis Date Noted   Mood changes 07/11/2021   Muscle strain 06/06/2021   Pre-diabetes 10/08/2020   Chest pain at rest 09/07/2020   HTN (hypertension), benign 03/12/2011   Obesity 03/12/2011   History of Graves' disease 02/12/2007    REFERRING DIAG:  M54.41 (ICD-10-CM) - Right-sided low back pain with right-sided sciatica, unspecified chronicity  THERAPY DIAG:  Chronic low back pain, unspecified back pain laterality, unspecified whether sciatica present  Muscle weakness (generalized)  PERTINENT HISTORY:  None  PRECAUTIONS:  None  SUBJECTIVE:  Pt presents to PT with reports of continued lower back pain. Has been compliant with HEP with no adverse effect. Pt is ready to begin PT at this time.  PAIN:  Are you having pain?  Yes: NPRS scale: 5/10 Pain location: lower back PAIN TYPE: aching and sharp Pain description: intermittent  Aggravating factors: prolonged standing Relieving factors: heat, rest    OBJECTIVE:      PATIENT SURVEYS:  FOTO 52% - 03/06/2022   LE MMT:   MMT  Right 01/18/2022 Left 01/18/2022 Right 02/22/2022 Left 02/22/2022  Hip flexion  4/5 4/5 4/5 4+/5  Hip extension        Hip abduction 3+/5 3+/5 3+/5 4/5  Hip adduction        Hip external rotation        Hip internal rotation        Knee extension 5/5 5/5    Knee flexion 5/5 5/5    Ankle dorsiflexion         Ankle plantarflexion        Ankle inversion        Ankle eversion        Grossly        (Blank rows = not tested)   FUNCTIONAL TESTS:  30 seconds chair stand test: 8 reps - no UE support (03/15/2022)   TODAY'S TREATMENT  [facility] Adult PT Treatment:                                                DATE: 04/19/2022 Therapeutic Exercise: Row 2x10 20# Shoulder ext with abd contraction 2x10 20# FM deadlift 2x10 20# Paloff press 2x10 10# Standing chop x 10  13# Cybex hip abd/ext 37.5# x 10 BIL  [facility] Adult PT Treatment:                                                DATE: 04/17/2022 Therapeutic Exercise: NuStep lvl 6 UE/LE x 4 min while taking subjective Row 2x10 20# FM deadlift 2x10 20# Paloff press 2x10 10# Standing chop x 10 13# Dead bugs 2x5 BIL - with physioball Bird dog 2x5 Side plank from knee 2x15" each STS 2x10 10# KB Cybex hip abduction 37.5# x 10 BIL  [facility] Adult PT Treatment:                                                DATE: 04/12/2022 Therapeutic Exercise: NuStep lvl 5 UE/LE x 3 min while taking subjective Row 2x10 20# FM deadlift 2x10 20# Paloff press 2x10 10# Standing chop x 10 10# Dead bugs 2x5 BIL - with physioball Bird dog 2x5 Cybex hip abduction 30# 2x10 BIL  PATIENT EDUCATION:  Education details: eval findings, FOTO, HEP, POC Person educated: Patient Education method: Explanation, Demonstration, and Handouts Education comprehension: verbalized understanding and returned demonstration     HOME EXERCISE PROGRAM: Access Code: [email or web address] URL: [email or web address] Date: 02/01/2022 Prepared by: Octavio Manns  Exercises Supine Sciatic Nerve  Glide - 1-2 x daily - 7 x weekly - 2 sets - 10 reps Supine Lower Trunk Rotation - 1-2 x daily - 7 x weekly - 2 sets - 10 reps Supine Piriformis Stretch with Leg Straight - 1-2 x daily - 7 x weekly - 2 reps - 30 sec hold Hooklying Clamshell with Resistance - 1-2 x daily - 7 x weekly - 3 sets - 10 reps Supine Posterior Pelvic Tilt - 1-2 x daily - 7 x weekly - 2 sets - 10 reps - 5 sec hold Active Straight Leg Raise with Quad Set - 1 x daily - 7 x weekly - 2 sets - 10 reps     ASSESSMENT:   CLINICAL IMPRESSION: Pt was able to complete all prescribed exercises with no adverse effect. Therapy today continued to progress core and proximal hip strengthening. She shows continued improvement in strength and mobility. Will continue to progress as able per POC.     OBJECTIVE IMPAIRMENTS decreased balance, decreased endurance, decreased mobility, difficulty walking, decreased ROM, decreased strength, and pain.    ACTIVITY LIMITATIONS community activity, occupation, and yard work.    PERSONAL FACTORS Fitness, Time since onset of injury/illness/exacerbation, and 1-2 comorbidities: HTN, Graves disease   are also affecting patient's functional outcome.      GOALS: Goals reviewed with patient? No   SHORT TERM GOALS:   STG Name Target Date Goal status  1 Pt will be compliant and knowledgeable with initial HEP for improved comfort and carryover Baseline: initial HEP given 02/08/2022 MET  2 Pt will self report lower back and R LE pain no greater than 6/10 for improved comfort and functional ability Baseline: 10/10 at worst 02/08/2022 MET    LONG TERM GOALS:    LTG Name Target Date Goal status  1 Pt will self report lower back and R LE pain no greater than 2/10 for improved comfort and functional ability Baseline: 10/10 at worst 04/26/2022  ONGOING  2 Pt will improve FOTO function score to no less than 63% as proxy for functional improvement Baseline: 47% function 03/06/2022: 51% function 04/26/2022 ONGOING   3 Pt will increase 30 Second Sit to Stand rep count to no less than 10 reps for improved balance, strength, and functional mobility Baseline: 8 reps  03/15/2022: 8 reps - no UE support 04/26/2022 ONGOING  4 Pt will improve bilateral hip abductor strength to no less than 4/5 for improved functional mobility and decreased pain Baseline: see chart 04/26/2022 ONGOING  5 Pt will be able to stand for >45 min without increase in LBP for improved functional ability with work and ADLs Baseline: unable 04/26/2022 ONGOING    PLAN: PT FREQUENCY: 2x/week   PT DURATION: 6 weeks   PLANNED INTERVENTIONS: Therapeutic exercises, Therapeutic activity, Neuromuscular re-education, Balance training, Gait training, Patient/Family education, Joint mobilization, Aquatic Therapy, Dry Needling, Electrical stimulation, Cryotherapy, Moist heat, and Manual therapy   PLAN FOR NEXT SESSION: assess HEP response,     Ward Chatters, PT 04/19/2022, 12:12 PM

## 2022-04-24 ENCOUNTER — Encounter: Payer: Self-pay | Admitting: *Deleted

## 2022-04-24 ENCOUNTER — Ambulatory Visit: Payer: 59

## 2022-04-24 DIAGNOSIS — R2689 Other abnormalities of gait and mobility: Secondary | ICD-10-CM

## 2022-04-24 DIAGNOSIS — M545 Low back pain, unspecified: Secondary | ICD-10-CM | POA: Diagnosis not present

## 2022-04-24 DIAGNOSIS — G8929 Other chronic pain: Secondary | ICD-10-CM | POA: Diagnosis not present

## 2022-04-24 DIAGNOSIS — M6281 Muscle weakness (generalized): Secondary | ICD-10-CM

## 2022-04-24 NOTE — Therapy (Signed)
OUTPATIENT PHYSICAL THERAPY TREATMENT NOTE   Patient Name: Alexandra Benjamin MRN: [id number] DOB:02-25-1976, 46 y.o., female Today's Date: 04/24/2022  PCP: Donney Dice, DO REFERRING PROVIDER: Zenia Resides, MD   PT End of Session - 04/24/22 1217     Visit Number 21    Number of Visits 25    Date for PT Re-Evaluation 04/26/22    Authorization Type [facility]    PT Start Time 1217    PT Stop Time 4970    PT Time Calculation (min) 39 min    Activity Tolerance Patient tolerated treatment well    Behavior During Therapy [facility] for tasks assessed/performed                               Past Medical History:  Diagnosis Date   Anxiety    Exposure to sexually transmitted disease (STD) 09/25/2019   Hypertension    Thyroid disease    Past Surgical History:  Procedure Laterality Date   FINGER FRACTURE SURGERY     TUBAL LIGATION     Patient Active Problem List   Diagnosis Date Noted   Mood changes 07/11/2021   Muscle strain 06/06/2021   Pre-diabetes 10/08/2020   Chest pain at rest 09/07/2020   HTN (hypertension), benign 03/12/2011   Obesity 03/12/2011   History of Graves' disease 02/12/2007    REFERRING DIAG:  M54.41 (ICD-10-CM) - Right-sided low back pain with right-sided sciatica, unspecified chronicity  THERAPY DIAG:  Chronic low back pain, unspecified back pain laterality, unspecified whether sciatica present  Muscle weakness (generalized)  Other abnormalities of gait and mobility  PERTINENT HISTORY:  None  PRECAUTIONS:  None  SUBJECTIVE:  Pt presents to PT with reports of continued lower back pain. Has been compliant with HEP with no adverse effect. Pt is ready to begin PT at this time.  PAIN:  Are you having pain?  Yes: NPRS scale: 3/10 Pain location: lower back PAIN TYPE: aching and sharp Pain description: intermittent  Aggravating factors: prolonged standing Relieving factors: heat, rest    OBJECTIVE:      PATIENT SURVEYS:   FOTO 52% - 03/06/2022   LE MMT:   MMT Right 01/18/2022 Left 01/18/2022 Right 02/22/2022 Left 02/22/2022  Hip flexion  4/5 4/5 4/5 4+/5  Hip extension        Hip abduction 3+/5 3+/5 3+/5 4/5  Hip adduction        Hip external rotation        Hip internal rotation        Knee extension 5/5 5/5    Knee flexion 5/5 5/5    Ankle dorsiflexion         Ankle plantarflexion        Ankle inversion        Ankle eversion        Grossly        (Blank rows = not tested)   FUNCTIONAL TESTS:  30 seconds chair stand test: 8 reps - no UE support (03/15/2022)   TODAY'S TREATMENT  [facility] Adult PT Treatment:                                                DATE: 04/24/2022 Therapeutic Exercise: NuStep lvl 5 LE only x 3 min while taking subjective Row x  10 20# FM deadlift x 10 20# Paloff press x 10 13# Half kneeling chop x 10 10# Dead bugs 2x5 BIL - with physioball Bird dog 2x5 Side plank from knee 2x20" each  [place] Adult PT Treatment:                                                DATE: 04/19/2022 Therapeutic Exercise: Row 2x10 20# Shoulder ext with abd contraction 2x10 20# FM deadlift 2x10 20# Paloff press 2x10 10# Standing chop x 10 13# Cybex hip abd/ext 37.5# x 10 BIL  [facility] Adult PT Treatment:                                                DATE: 04/17/2022 Therapeutic Exercise: NuStep lvl 6 UE/LE x 4 min while taking subjective Row 2x10 20# FM deadlift 2x10 20# Paloff press 2x10 10# Standing chop x 10 13# Dead bugs 2x5 BIL - with physioball Bird dog 2x5 Side plank from knee 2x15" each STS 2x10 10# KB Cybex hip abduction 37.5# x 10 BIL  [facility] Adult PT Treatment:                                                DATE: 04/12/2022 Therapeutic Exercise: NuStep lvl 5 UE/LE x 3 min while taking subjective Row 2x10 20# FM deadlift 2x10 20# Paloff press 2x10 10# Standing chop x 10 10# Dead bugs 2x5 BIL - with physioball Bird dog 2x5 Cybex hip abduction 30# 2x10 BIL  PATIENT EDUCATION:   Education details: eval findings, FOTO, HEP, POC Person educated: Patient Education method: Explanation, Demonstration, and Handouts Education comprehension: verbalized understanding and returned demonstration     HOME EXERCISE PROGRAM: Access Code: [email or web address] URL: [email or web address] Date: 02/01/2022 Prepared by: Octavio Manns  Exercises Supine Sciatic Nerve Glide - 1-2 x daily - 7 x weekly - 2 sets - 10 reps Supine Lower Trunk Rotation - 1-2 x daily - 7 x weekly - 2 sets - 10 reps Supine Piriformis Stretch with Leg Straight - 1-2 x daily - 7 x weekly - 2 reps - 30 sec hold Hooklying Clamshell with Resistance - 1-2 x daily - 7 x weekly - 3 sets - 10 reps Supine Posterior Pelvic Tilt - 1-2 x daily - 7 x weekly - 2 sets - 10 reps - 5 sec hold Active Straight Leg Raise with Quad Set - 1 x daily - 7 x weekly - 2 sets - 10 reps     ASSESSMENT:   CLINICAL IMPRESSION: Pt was able to complete all prescribed exercises with no adverse effect or increase in pain. Therapy continued to progress core and proximal hip strenghtening, with pt continuing to progress well despite continued pain. PT will continue per POC as prescribed.     OBJECTIVE IMPAIRMENTS decreased balance, decreased endurance, decreased mobility, difficulty walking, decreased ROM, decreased strength, and pain.    ACTIVITY LIMITATIONS community activity, occupation, and yard work.    PERSONAL FACTORS Fitness, Time since onset of injury/illness/exacerbation, and 1-2 comorbidities: HTN, Graves disease   are also affecting patient's functional outcome.  GOALS: Goals reviewed with patient? No   SHORT TERM GOALS:   STG Name Target Date Goal status  1 Pt will be compliant and knowledgeable with initial HEP for improved comfort and carryover Baseline: initial HEP given 02/08/2022 MET  2 Pt will self report lower back and R LE pain no greater than 6/10 for improved comfort and functional ability Baseline:  10/10 at worst 02/08/2022 MET    LONG TERM GOALS:    LTG Name Target Date Goal status  1 Pt will self report lower back and R LE pain no greater than 2/10 for improved comfort and functional ability Baseline: 10/10 at worst 04/26/2022 ONGOING  2 Pt will improve FOTO function score to no less than 63% as proxy for functional improvement Baseline: 47% function 03/06/2022: 51% function 04/26/2022 ONGOING  3 Pt will increase 30 Second Sit to Stand rep count to no less than 10 reps for improved balance, strength, and functional mobility Baseline: 8 reps  03/15/2022: 8 reps - no UE support 04/26/2022 ONGOING  4 Pt will improve bilateral hip abductor strength to no less than 4/5 for improved functional mobility and decreased pain Baseline: see chart 04/26/2022 ONGOING  5 Pt will be able to stand for >45 min without increase in LBP for improved functional ability with work and ADLs Baseline: unable 04/26/2022 ONGOING    PLAN: PT FREQUENCY: 2x/week   PT DURATION: 6 weeks   PLANNED INTERVENTIONS: Therapeutic exercises, Therapeutic activity, Neuromuscular re-education, Balance training, Gait training, Patient/Family education, Joint mobilization, Aquatic Therapy, Dry Needling, Electrical stimulation, Cryotherapy, Moist heat, and Manual therapy   PLAN FOR NEXT SESSION: assess HEP response,     Ward Chatters, PT 04/24/2022, 1:45 PM

## 2022-04-26 ENCOUNTER — Ambulatory Visit: Payer: 59

## 2022-04-26 DIAGNOSIS — M6281 Muscle weakness (generalized): Secondary | ICD-10-CM

## 2022-04-26 DIAGNOSIS — M545 Low back pain, unspecified: Secondary | ICD-10-CM | POA: Diagnosis not present

## 2022-04-26 DIAGNOSIS — R2689 Other abnormalities of gait and mobility: Secondary | ICD-10-CM

## 2022-04-26 DIAGNOSIS — G8929 Other chronic pain: Secondary | ICD-10-CM | POA: Diagnosis not present

## 2022-04-26 NOTE — Therapy (Signed)
OUTPATIENT PHYSICAL THERAPY TREATMENT NOTE   Patient Name: Alexandra Benjamin MRN: [id number] DOB:December 28, 1975, 46 y.o., female Today's Date: 04/26/2022  PCP: Donney Dice, DO REFERRING PROVIDER: Donney Dice, DO   PT End of Session - 04/26/22 1052     Visit Number 22    Number of Visits 25    Date for PT Re-Evaluation 04/26/22    Authorization Type [facility]    PT Start Time 1052    PT Stop Time 1130    PT Time Calculation (min) 38 min    Activity Tolerance Patient tolerated treatment well    Behavior During Therapy WFL for tasks assessed/performed                                Past Medical History:  Diagnosis Date   Anxiety    Exposure to sexually transmitted disease (STD) 09/25/2019   Hypertension    Thyroid disease    Past Surgical History:  Procedure Laterality Date   FINGER FRACTURE SURGERY     TUBAL LIGATION     Patient Active Problem List   Diagnosis Date Noted   Mood changes 07/11/2021   Muscle strain 06/06/2021   Pre-diabetes 10/08/2020   Chest pain at rest 09/07/2020   HTN (hypertension), benign 03/12/2011   Obesity 03/12/2011   History of Graves' disease 02/12/2007    REFERRING DIAG:  M54.41 (ICD-10-CM) - Right-sided low back pain with right-sided sciatica, unspecified chronicity  THERAPY DIAG:  Chronic low back pain, unspecified back pain laterality, unspecified whether sciatica present  Muscle weakness (generalized)  Other abnormalities of gait and mobility  PERTINENT HISTORY:  None  PRECAUTIONS:  None  SUBJECTIVE:  Pt presents with continued lower back and R LE pain. Pt has been compliant with HEP with on adverse effect. Pt is ready to begin PT at this time.   PAIN:  Are you having pain?  Yes: NPRS scale: 3/10 Pain location: lower back PAIN TYPE: aching and sharp Pain description: intermittent  Aggravating factors: prolonged standing Relieving factors: heat, rest    OBJECTIVE:      PATIENT SURVEYS:  FOTO  52% - 03/06/2022   LE MMT:   MMT Right 01/18/2022 Left 01/18/2022 Right 02/22/2022 Left 02/22/2022  Hip flexion  4/5 4/5 4/5 4+/5  Hip extension        Hip abduction 3+/5 3+/5 3+/5 4/5  Hip adduction        Hip external rotation        Hip internal rotation        Knee extension 5/5 5/5    Knee flexion 5/5 5/5    Ankle dorsiflexion         Ankle plantarflexion        Ankle inversion        Ankle eversion        Grossly        (Blank rows = not tested)   FUNCTIONAL TESTS:  30 seconds chair stand test: 8 reps - no UE support (03/15/2022)   TODAY'S TREATMENT  [facility] Adult PT Treatment:                                                DATE: 04/26/2022 Therapeutic Exercise: Elliptical lvl 1.0 LE only x 3 min while taking subjective Row  x 10 20# FM deadlift 2x10 23# Paloff press 2x10 13# Standing chop x 10 10# Standing lift x 10 - 7# Dead bugs 2x5 BIL Bird dog 2x5  [facility] Adult PT Treatment:                                                DATE: 04/24/2022 Therapeutic Exercise: NuStep lvl 5 LE only x 3 min while taking subjective Row x 10 20# FM deadlift x 10 20# Paloff press x 10 13# Half kneeling chop x 10 10# Dead bugs 2x5 BIL - with physioball Bird dog 2x5 Side plank from knee 2x20" each  [facility] Adult PT Treatment:                                                DATE: 04/19/2022 Therapeutic Exercise: Row 2x10 20# Shoulder ext with abd contraction 2x10 20# FM deadlift 2x10 20# Paloff press 2x10 10# Standing chop x 10 13# Cybex hip abd/ext 37.5# x 10 BIL  PATIENT EDUCATION:  Education details: continue HEP Person educated: Patient Education method: Explanation, Demonstration, and Handouts Education comprehension: verbalized understanding and returned demonstration     HOME EXERCISE PROGRAM: Access Code: [email or web address] URL: [email or web address] Date: 02/01/2022 Prepared by: Octavio Manns  Exercises Supine Sciatic Nerve Glide - 1-2 x daily - 7 x weekly - 2 sets - 10  reps Supine Lower Trunk Rotation - 1-2 x daily - 7 x weekly - 2 sets - 10 reps Supine Piriformis Stretch with Leg Straight - 1-2 x daily - 7 x weekly - 2 reps - 30 sec hold Hooklying Clamshell with Resistance - 1-2 x daily - 7 x weekly - 3 sets - 10 reps Supine Posterior Pelvic Tilt - 1-2 x daily - 7 x weekly - 2 sets - 10 reps - 5 sec hold Active Straight Leg Raise with Quad Set - 1 x daily - 7 x weekly - 2 sets - 10 reps     ASSESSMENT:   CLINICAL IMPRESSION: Pt was able to complete all prescribed exercises with no adsverse effect or increase in pain. Therapy focused on continued core and proximal hip strengthening for decreasing pain and function. Pt will continue to be seen and progressed as able per POC.    OBJECTIVE IMPAIRMENTS decreased balance, decreased endurance, decreased mobility, difficulty walking, decreased ROM, decreased strength, and pain.    ACTIVITY LIMITATIONS community activity, occupation, and yard work.    PERSONAL FACTORS Fitness, Time since onset of injury/illness/exacerbation, and 1-2 comorbidities: HTN, Graves disease   are also affecting patient's functional outcome.      GOALS: Goals reviewed with patient? No   SHORT TERM GOALS:   STG Name Target Date Goal status  1 Pt will be compliant and knowledgeable with initial HEP for improved comfort and carryover Baseline: initial HEP given 02/08/2022 MET  2 Pt will self report lower back and R LE pain no greater than 6/10 for improved comfort and functional ability Baseline: 10/10 at worst 02/08/2022 MET    LONG TERM GOALS:    LTG Name Target Date Goal status  1 Pt will self report lower back and R LE pain no greater than 2/10 for improved comfort and functional ability Baseline: 10/10  at worst 04/26/2022 ONGOING  2 Pt will improve FOTO function score to no less than 63% as proxy for functional improvement Baseline: 47% function 03/06/2022: 51% function 04/26/2022 ONGOING  3 Pt will increase 30 Second Sit to  Stand rep count to no less than 10 reps for improved balance, strength, and functional mobility Baseline: 8 reps  03/15/2022: 8 reps - no UE support 04/26/2022 ONGOING  4 Pt will improve bilateral hip abductor strength to no less than 4/5 for improved functional mobility and decreased pain Baseline: see chart 04/26/2022 ONGOING  5 Pt will be able to stand for >45 min without increase in LBP for improved functional ability with work and ADLs Baseline: unable 04/26/2022 ONGOING    PLAN: PT FREQUENCY: 2x/week   PT DURATION: 6 weeks   PLANNED INTERVENTIONS: Therapeutic exercises, Therapeutic activity, Neuromuscular re-education, Balance training, Gait training, Patient/Family education, Joint mobilization, Aquatic Therapy, Dry Needling, Electrical stimulation, Cryotherapy, Moist heat, and Manual therapy   PLAN FOR NEXT SESSION: assess HEP response,     Ward Chatters, PT 04/26/2022, 12:21 PM

## 2022-05-01 ENCOUNTER — Ambulatory Visit: Payer: 59

## 2022-05-01 NOTE — Therapy (Incomplete)
OUTPATIENT PHYSICAL THERAPY TREATMENT NOTE   Patient Name: Alexandra Benjamin MRN: [id number] DOB:Nov 09, 1976, 46 y.o., female Today's Date: 05/01/2022  PCP: Donney Dice, DO REFERRING PROVIDER: Donney Dice, DO                        Past Medical History:  Diagnosis Date   Anxiety    Exposure to sexually transmitted disease (STD) 09/25/2019   Hypertension    Thyroid disease    Past Surgical History:  Procedure Laterality Date   FINGER FRACTURE SURGERY     TUBAL LIGATION     Patient Active Problem List   Diagnosis Date Noted   Mood changes 07/11/2021   Muscle strain 06/06/2021   Pre-diabetes 10/08/2020   Chest pain at rest 09/07/2020   HTN (hypertension), benign 03/12/2011   Obesity 03/12/2011   History of Graves' disease 02/12/2007    REFERRING DIAG:  M54.41 (ICD-10-CM) - Right-sided low back pain with right-sided sciatica, unspecified chronicity  THERAPY DIAG:  No diagnosis found.  PERTINENT HISTORY:  None  PRECAUTIONS:  None  SUBJECTIVE:  ***  PAIN:  Are you having pain?  Yes: NPRS scale: 3/10 Pain location: lower back PAIN TYPE: aching and sharp Pain description: intermittent  Aggravating factors: prolonged standing Relieving factors: heat, rest    OBJECTIVE:      PATIENT SURVEYS:  FOTO 52% - 03/06/2022   LE MMT:   MMT Right 01/18/2022 Left 01/18/2022 Right 02/22/2022 Left 02/22/2022  Hip flexion  4/5 4/5 4/5 4+/5  Hip extension        Hip abduction 3+/5 3+/5 3+/5 4/5  Hip adduction        Hip external rotation        Hip internal rotation        Knee extension 5/5 5/5    Knee flexion 5/5 5/5    Ankle dorsiflexion         Ankle plantarflexion        Ankle inversion        Ankle eversion        Grossly        (Blank rows = not tested)   FUNCTIONAL TESTS:  30 seconds chair stand test: 8 reps - no UE support (03/15/2022)   TODAY'S TREATMENT  [facility] Adult PT Treatment:                                                DATE:  05/01/2022 Therapeutic Exercise: Elliptical lvl 1.0 LE only x 3 min while taking subjective Row x 10 20# FM deadlift 2x10 23# Paloff press 2x10 13# Standing chop x 10 10# Standing lift x 10 - 7# Dead bugs 2x5 BIL Bird dog 2x5  [facility] Adult PT Treatment:                                                DATE: 04/26/2022 Therapeutic Exercise: Elliptical lvl 1.0 LE only x 3 min while taking subjective Row x 10 20# FM deadlift 2x10 23# Paloff press 2x10 13# Standing chop x 10 10# Standing lift x 10 - 7# Dead bugs 2x5 BIL Bird dog 2x5  [facility] Adult PT Treatment:  DATE: 04/24/2022 Therapeutic Exercise: NuStep lvl 5 LE only x 3 min while taking subjective Row x 10 20# FM deadlift x 10 20# Paloff press x 10 13# Half kneeling chop x 10 10# Dead bugs 2x5 BIL - with physioball Bird dog 2x5 Side plank from knee 2x20" each  PATIENT EDUCATION:  Education details: continue HEP Person educated: Patient Education method: Explanation, Demonstration, and Handouts Education comprehension: verbalized understanding and returned demonstration     HOME EXERCISE PROGRAM: Access Code: [email or web address] URL: [email or web address] Date: 02/01/2022 Prepared by: Octavio Manns  Exercises Supine Sciatic Nerve Glide - 1-2 x daily - 7 x weekly - 2 sets - 10 reps Supine Lower Trunk Rotation - 1-2 x daily - 7 x weekly - 2 sets - 10 reps Supine Piriformis Stretch with Leg Straight - 1-2 x daily - 7 x weekly - 2 reps - 30 sec hold Hooklying Clamshell with Resistance - 1-2 x daily - 7 x weekly - 3 sets - 10 reps Supine Posterior Pelvic Tilt - 1-2 x daily - 7 x weekly - 2 sets - 10 reps - 5 sec hold Active Straight Leg Raise with Quad Set - 1 x daily - 7 x weekly - 2 sets - 10 reps     ASSESSMENT:   CLINICAL IMPRESSION: ***    OBJECTIVE IMPAIRMENTS decreased balance, decreased endurance, decreased mobility, difficulty walking, decreased ROM, decreased  strength, and pain.    ACTIVITY LIMITATIONS community activity, occupation, and yard work.    PERSONAL FACTORS Fitness, Time since onset of injury/illness/exacerbation, and 1-2 comorbidities: HTN, Graves disease   are also affecting patient's functional outcome.      GOALS: Goals reviewed with patient? No   SHORT TERM GOALS:   STG Name Target Date Goal status  1 Pt will be compliant and knowledgeable with initial HEP for improved comfort and carryover Baseline: initial HEP given 02/08/2022 MET  2 Pt will self report lower back and R LE pain no greater than 6/10 for improved comfort and functional ability Baseline: 10/10 at worst 02/08/2022 MET    LONG TERM GOALS:    LTG Name Target Date Goal status  1 Pt will self report lower back and R LE pain no greater than 2/10 for improved comfort and functional ability Baseline: 10/10 at worst 04/26/2022 ONGOING  2 Pt will improve FOTO function score to no less than 63% as proxy for functional improvement Baseline: 47% function 03/06/2022: 51% function 04/26/2022 ONGOING  3 Pt will increase 30 Second Sit to Stand rep count to no less than 10 reps for improved balance, strength, and functional mobility Baseline: 8 reps  03/15/2022: 8 reps - no UE support 04/26/2022 ONGOING  4 Pt will improve bilateral hip abductor strength to no less than 4/5 for improved functional mobility and decreased pain Baseline: see chart 04/26/2022 ONGOING  5 Pt will be able to stand for >45 min without increase in LBP for improved functional ability with work and ADLs Baseline: unable 04/26/2022 ONGOING    PLAN: PT FREQUENCY: 2x/week   PT DURATION: 6 weeks   PLANNED INTERVENTIONS: Therapeutic exercises, Therapeutic activity, Neuromuscular re-education, Balance training, Gait training, Patient/Family education, Joint mobilization, Aquatic Therapy, Dry Needling, Electrical stimulation, Cryotherapy, Moist heat, and Manual therapy   PLAN FOR NEXT SESSION: assess HEP response,      Ward Chatters, PT 05/01/2022, 9:30 AM

## 2022-05-03 ENCOUNTER — Ambulatory Visit: Payer: 59

## 2022-05-03 DIAGNOSIS — M545 Low back pain, unspecified: Secondary | ICD-10-CM | POA: Diagnosis not present

## 2022-05-03 DIAGNOSIS — G8929 Other chronic pain: Secondary | ICD-10-CM

## 2022-05-03 DIAGNOSIS — M6281 Muscle weakness (generalized): Secondary | ICD-10-CM

## 2022-05-03 DIAGNOSIS — R2689 Other abnormalities of gait and mobility: Secondary | ICD-10-CM | POA: Diagnosis not present

## 2022-05-03 NOTE — Therapy (Addendum)
OUTPATIENT PHYSICAL THERAPY TREATMENT NOTE/DISCHARGE  PHYSICAL THERAPY DISCHARGE SUMMARY  Visits from Start of Care: 23  Current functional level related to goals / functional outcomes: See goals/objective   Remaining deficits: See goals/objective   Education / Equipment: HEP   Patient agrees to discharge. Patient goals were  mostly met . Patient is being discharged due to not returning since the last visit.   Patient Name: Alexandra Benjamin MRN: [id number] DOB:April 19, 1976, 46 y.o., female Today's Date: 05/03/2022  PCP: Donney Dice, DO REFERRING PROVIDER: Donney Dice, DO   PT End of Session - 05/03/22 1220     Visit Number 23    Number of Visits 25    Date for PT Re-Evaluation 05/31/22    Authorization Type [facility]    PT Start Time 1220    PT Stop Time 1302    PT Time Calculation (min) 42 min    Activity Tolerance Patient tolerated treatment well    Behavior During Therapy WFL for tasks assessed/performed                                 Past Medical History:  Diagnosis Date   Anxiety    Exposure to sexually transmitted disease (STD) 09/25/2019   Hypertension    Thyroid disease    Past Surgical History:  Procedure Laterality Date   FINGER FRACTURE SURGERY     TUBAL LIGATION     Patient Active Problem List   Diagnosis Date Noted   Mood changes 07/11/2021   Muscle strain 06/06/2021   Pre-diabetes 10/08/2020   Chest pain at rest 09/07/2020   HTN (hypertension), benign 03/12/2011   Obesity 03/12/2011   History of Graves' disease 02/12/2007    REFERRING DIAG:  M54.41 (ICD-10-CM) - Right-sided low back pain with right-sided sciatica, unspecified chronicity  THERAPY DIAG:  Chronic low back pain, unspecified back pain laterality, unspecified whether sciatica present - Plan: PT plan of care cert/re-cert  Muscle weakness (generalized) - Plan: PT plan of care cert/re-cert  PERTINENT HISTORY:  None  PRECAUTIONS:  None  SUBJECTIVE:   Pt presents to PT with reports of increased L knee pain after [place] the beach. Her back and R hip are feeling much better today. Pt is ready to begin PT at this time.  PAIN:  Are you having pain?  Yes: NPRS scale: 3/10 Pain location: L knee PAIN TYPE: aching and sharp Pain description: intermittent  Aggravating factors: prolonged standing Relieving factors: heat, rest    OBJECTIVE:      PATIENT SURVEYS:  FOTO 58% - 05/03/2022   LE MMT:   MMT Right 02/22/2022 Right 05/03/2022  Hip flexion  4/5 4+/5  Hip extension    Hip abduction 3+/5 3+/5  Hip adduction    Hip external rotation    Hip internal rotation    Knee extension    Knee flexion    Ankle dorsiflexion     Ankle plantarflexion    Ankle inversion    Ankle eversion    Grossly    (Blank rows = not tested)   FUNCTIONAL TESTS:  30 seconds chair stand test: 10 reps - 05/03/2022   TODAY'S TREATMENT  [facility] Adult PT Treatment:  DATE: 05/03/2022 Therapeutic Exercise: Supine Lower Trunk Rotation x 10 Supine Piriformis Stretch with Leg Straight x 10 Supine Bridge x 10 Side Plank on Knees 2x15" Active Straight Leg Raise with Quad Set x 10 Supine Dead Bug with Leg Extension  x 5 Sit to Stand Without Arm Support x 10 Side Stepping with Counter Support  x 2 laps GTB Standing Hip Abduction with Resistance at Ankles and Counter Support  2x10 GTB Standing Hip Extension with Resistance at Ankles and Counter Support 2x10 GTB Row x 15 Black TB  [facility] Adult PT Treatment:                                                DATE: 04/26/2022 Therapeutic Exercise: Elliptical lvl 1.0 LE only x 3 min while taking subjective Row x 10 20# FM deadlift 2x10 23# Paloff press 2x10 13# Standing chop x 10 10# Standing lift x 10 - 7# Dead bugs 2x5 BIL Bird dog 2x5  [facility] Adult PT Treatment:                                                DATE: 04/24/2022 Therapeutic Exercise: NuStep  lvl 5 LE only x 3 min while taking subjective Row x 10 20# FM deadlift x 10 20# Paloff press x 10 13# Half kneeling chop x 10 10# Dead bugs 2x5 BIL - with physioball Bird dog 2x5 Side plank from knee 2x20" each  PATIENT EDUCATION:  Education details: continue HEP Person educated: Patient Education method: Explanation, Demonstration, and Handouts Education comprehension: verbalized understanding and returned demonstration     HOME EXERCISE PROGRAM: Access Code: [email or web address] URL: [email or web address] Date: 05/03/2022 Prepared by: Octavio Manns  Exercises - Supine Lower Trunk Rotation  - 3-4 x weekly - 2 sets - 10 reps - Supine Piriformis Stretch with Leg Straight  - 3-4 x weekly - 2 reps - 30 sec hold - Supine Bridge  - 3-4 x weekly - 2-3 sets - 10 reps - Side Plank on Knees  - 3-4 x weekly - 2-3 reps - 15 seconds hold - Active Straight Leg Raise with Quad Set  - 3-4 x weekly - 2 sets - 10 reps - Supine Dead Bug with Leg Extension  - 3-4 x weekly - 2-3 sets - 10 reps - Sit to Stand Without Arm Support  - 3-4 x weekly - 2-3 sets - 10 reps - Side Stepping with Counter Support  - 3-4 x weekly - 2-3 sets - at kitchen counter hold - Standing Hip Abduction with Resistance at Ankles and Counter Support  - 3-4 x weekly - 2-3 sets - 10 reps - Standing Hip Extension with Resistance at Ankles and Counter Support  - 3-4 x weekly - 2-3 sets - 10 reps     ASSESSMENT:   CLINICAL IMPRESSION: Pt was able to complete all prescribed exercises with no adverse effect or increase in pain. Over the course of PT treatment she has continued to improve strength and functional mobility, while decreasing back and R hip pain. She has met her 30 Second Sit to Stand goal and shows improving standing activity tolerance. Her FOTO continues to improve and she should continue to improve with updated HEP. PT  will see her for reduced treatment frequency to assess continued improvement with HEP compliance  and plan to discharge at that time if all goals are met.     OBJECTIVE IMPAIRMENTS decreased balance, decreased endurance, decreased mobility, difficulty walking, decreased ROM, decreased strength, and pain.    ACTIVITY LIMITATIONS community activity, occupation, and yard work.    PERSONAL FACTORS Fitness, Time since onset of injury/illness/exacerbation, and 1-2 comorbidities: HTN, Graves disease   are also affecting patient's functional outcome.      GOALS: Goals reviewed with patient? No   SHORT TERM GOALS:   STG Name Target Date Goal status  1 Pt will be compliant and knowledgeable with initial HEP for improved comfort and carryover Baseline: initial HEP given 02/08/2022 MET  2 Pt will self report lower back and R LE pain no greater than 6/10 for improved comfort and functional ability Baseline: 10/10 at worst 02/08/2022 MET    LONG TERM GOALS:    LTG Name Target Date Goal status  1 Pt will self report lower back and R LE pain no greater than 2/10 for improved comfort and functional ability Baseline: 10/10 at worst 05/31/2022 ONGOING  2 Pt will improve FOTO function score to no less than 63% as proxy for functional improvement Baseline: 47% function 03/06/2022: 51% function 05/31/2022 ONGOING  3 Pt will increase 30 Second Sit to Stand rep count to no less than 10 reps for improved balance, strength, and functional mobility Baseline: 8 reps  03/15/2022: 8 reps - no UE support 05/03/2022: 10 reps 04/26/2022 MET  4 Pt will improve bilateral hip abductor strength to no less than 4/5 for improved functional mobility and decreased pain Baseline: see chart 05/31/2022 ONGOING  5 Pt will be able to stand for >45 min without increase in LBP for improved functional ability with work and ADLs Baseline: unable 05/31/2022 ONGOING    PLAN: PT FREQUENCY: 1x/week   PT DURATION: Every other week   PLANNED INTERVENTIONS: Therapeutic exercises, Therapeutic activity, Neuromuscular re-education, Balance  training, Gait training, Patient/Family education, Joint mobilization, Aquatic Therapy, Dry Needling, Electrical stimulation, Cryotherapy, Moist heat, and Manual therapy   PLAN FOR NEXT SESSION: assess HEP response,     Ward Chatters, PT 05/03/2022, 1:45 PM

## 2022-05-09 ENCOUNTER — Ambulatory Visit: Payer: 59

## 2022-05-21 ENCOUNTER — Ambulatory Visit: Payer: 59

## 2023-06-21 ENCOUNTER — Other Ambulatory Visit (HOSPITAL_COMMUNITY): Admission: RE | Admit: 2023-06-21 | Payer: Commercial Managed Care - PPO | Source: Ambulatory Visit

## 2023-06-21 ENCOUNTER — Encounter: Payer: Self-pay | Admitting: Student

## 2023-06-21 ENCOUNTER — Telehealth: Payer: Self-pay | Admitting: Student

## 2023-06-21 ENCOUNTER — Ambulatory Visit (INDEPENDENT_AMBULATORY_CARE_PROVIDER_SITE_OTHER): Payer: Commercial Managed Care - PPO | Admitting: Student

## 2023-06-21 ENCOUNTER — Other Ambulatory Visit (HOSPITAL_COMMUNITY): Payer: Self-pay

## 2023-06-21 VITALS — BP 160/100 | HR 72 | Ht 65.0 in | Wt 195.0 lb

## 2023-06-21 DIAGNOSIS — Z8639 Personal history of other endocrine, nutritional and metabolic disease: Secondary | ICD-10-CM

## 2023-06-21 DIAGNOSIS — N924 Excessive bleeding in the premenopausal period: Secondary | ICD-10-CM | POA: Diagnosis not present

## 2023-06-21 DIAGNOSIS — I10 Essential (primary) hypertension: Secondary | ICD-10-CM | POA: Diagnosis not present

## 2023-06-21 DIAGNOSIS — Z124 Encounter for screening for malignant neoplasm of cervix: Secondary | ICD-10-CM | POA: Diagnosis not present

## 2023-06-21 DIAGNOSIS — A599 Trichomoniasis, unspecified: Secondary | ICD-10-CM

## 2023-06-21 DIAGNOSIS — N898 Other specified noninflammatory disorders of vagina: Secondary | ICD-10-CM | POA: Diagnosis not present

## 2023-06-21 LAB — POCT WET PREP (WET MOUNT)
Clue Cells Wet Prep Whiff POC: NEGATIVE
WBC, Wet Prep HPF POC: >20

## 2023-06-21 MED ORDER — METRONIDAZOLE 500 MG PO TABS
500.0000 mg | ORAL_TABLET | Freq: Two times a day (BID) | ORAL | 0 refills | Status: DC
  Filled 2023-06-21: qty 21, 11d supply, fill #0

## 2023-06-21 NOTE — Telephone Encounter (Signed)
Called patient x 2, did not answer.  Left HIPAA compliant voicemail.  Asked patient to call back clinic.  Patient positive for trichomonas infection, which is historically a sexually transmitted disease.  Survival on contaminated objects such as towels, have been reported however transmission outside of sexual activity has not been proven.  If patient calls back and provider is busy, please inform patient that provider has sent in metronidazole for 7 days.  Her partner will need to be treated, and recommend partner see their primary care provider to get antibiotic.  Recommend abstaining from sexual intercourse until treatment is completed and no longer having symptoms.

## 2023-06-21 NOTE — Progress Notes (Signed)
    SUBJECTIVE:   CHIEF COMPLAINT / HPI:   Vaginal Discharge: Patient is a 47 y.o. female presenting with vaginal discharge for 2/3 weeks.  She states the discharge is of thick consistency.  She endorses  vaginal odor.  She is interested in screening for sexually transmitted infections today. She has contraception with Tubal Ligation. She does not use barrier method consistently.  PERTINENT  PMH / PSH: None relevant  OBJECTIVE:   BP (!) 160/100   Pulse 72   Ht 5\' 5"  (1.651 m)   Wt 195 lb (88.5 kg)   LMP 06/02/2023   SpO2 98%   BMI 32.45 kg/m    General: NAD, pleasant, able to participate in exam Respiratory: Normal effort, no obvious respiratory distress Pelvic: VULVA: normal appearing vulva with no masses, tenderness or lesions, VAGINA: Normal appearing vagina with normal color, no lesions, with white and thin discharge present, CERVIX: No lesions, white and thin discharge present  **Chaperone Tashira CMA present for pelvic exam**  ASSESSMENT/PLAN:   No problem-specific Assessment & Plan notes found for this encounter.   Assessment:  46 y.o. female with vaginal discharge for 3 weeks as well as odor.  Physical exam significant for white discharge.  Wet prep performed today shows Trichomonas infection.  Plan: -Wet prep as above.  Will treat with metronidazole BID x7 days -Pending GC/chlamydia, RPR, HIV -Unable to reach patient with results. See separate phone encounter. Counseling provided in note. -Discussed protection during intercourse and contraceptive methods -Completed Pap smear today, will follow-up results -Follow-up as needed  Follow-up recommendations Scheduled patient for appointment to discuss chronic disease management.  Updated lab work on 06/21/2023.  Recommend discussion.  Tiffany Kocher, DO [facility]

## 2023-06-21 NOTE — Patient Instructions (Signed)
It was great to see you! Thank you for allowing me to participate in your care!   I recommend that you always bring your medications to each appointment as this makes it easy to ensure we are on the correct medications and helps [place] not miss when refills are needed.  Our plans for today:  - Follow-up in 2 weeks to discuss chronic medical conditions  We are checking some labs today, I will call you if they are abnormal will send you a MyChart message or a letter if they are normal.  If you do not hear about your labs in the next 2 weeks please let us know.  Take care and seek immediate care sooner if you develop any concerns. Please remember to show up 15 minutes before your scheduled appointment time!  Tiffany Kocher, DO [facility]

## 2023-06-25 ENCOUNTER — Telehealth: Payer: Self-pay | Admitting: Student

## 2023-06-25 ENCOUNTER — Telehealth: Payer: Self-pay

## 2023-06-25 ENCOUNTER — Other Ambulatory Visit (HOSPITAL_COMMUNITY): Payer: Self-pay

## 2023-06-25 DIAGNOSIS — A749 Chlamydial infection, unspecified: Secondary | ICD-10-CM

## 2023-06-25 MED ORDER — DOXYCYCLINE HYCLATE 100 MG PO TABS
100.0000 mg | ORAL_TABLET | Freq: Two times a day (BID) | ORAL | 0 refills | Status: DC
  Filled 2023-06-25: qty 20, 10d supply, fill #0

## 2023-06-25 NOTE — Telephone Encounter (Signed)
Faxed over STI paperwork to the [facility].

## 2023-06-25 NOTE — Telephone Encounter (Signed)
Called patient to discuss positive chlamydia result.  Patient did not answer.  Left HIPAA compliant voicemail to call back.  When patient calls back, please inform that she is positive for chlamydia which is an STD.  She will need to disclose results to any partners and encourage them to seek treatment.  I have sent antibiotic to her pharmacy.  Please note that all STDs are reported to the health department, as required by state law.  Her personal information is protected, and no one can search for these results.

## 2023-06-25 NOTE — Telephone Encounter (Signed)
-----   Message from [facility] Molly Maduro B sent at 06/25/2023 11:43 AM EDT ----- Regarding: STI reporting Pos chlamydia

## 2023-07-09 ENCOUNTER — Other Ambulatory Visit (HOSPITAL_COMMUNITY): Payer: Self-pay

## 2023-07-09 ENCOUNTER — Ambulatory Visit (INDEPENDENT_AMBULATORY_CARE_PROVIDER_SITE_OTHER): Payer: Commercial Managed Care - PPO | Admitting: Student

## 2023-07-09 ENCOUNTER — Encounter: Payer: Self-pay | Admitting: Student

## 2023-07-09 VITALS — BP 164/106 | HR 79 | Ht 65.0 in | Wt 195.0 lb

## 2023-07-09 DIAGNOSIS — D649 Anemia, unspecified: Secondary | ICD-10-CM

## 2023-07-09 DIAGNOSIS — Z1211 Encounter for screening for malignant neoplasm of colon: Secondary | ICD-10-CM | POA: Diagnosis not present

## 2023-07-09 DIAGNOSIS — I10 Essential (primary) hypertension: Secondary | ICD-10-CM | POA: Diagnosis not present

## 2023-07-09 DIAGNOSIS — Z113 Encounter for screening for infections with a predominantly sexual mode of transmission: Secondary | ICD-10-CM | POA: Diagnosis not present

## 2023-07-09 MED ORDER — LOSARTAN POTASSIUM-HCTZ 50-12.5 MG PO TABS
1.0000 | ORAL_TABLET | Freq: Every day | ORAL | 1 refills | Status: DC
  Filled 2023-07-09: qty 30, 30d supply, fill #0
  Filled 2023-08-12: qty 30, 30d supply, fill #1

## 2023-07-09 NOTE — Progress Notes (Signed)
    SUBJECTIVE:   CHIEF COMPLAINT / HPI:   HTN Known history of hypertension.  She has not been taking her 10 mg of amlodipine for many months.  Lab work was updated at last visit, no evidence of endorgan damage.  Asymptomatic today.  Agreeable to reinitiating medical therapy.  Anemia Hemoglobin of 9.7 two weeks ago.  Microcytic.  Overall asymptomatic.  Has started iron supplementation. Requesting lab work.  Abdominal cyst Patient states she has had a small cyst forming on her abdomen for the past several months.  Not painful, no drainage.  No systemic symptoms.  Is interested in getting it removed.  Trichomonas/chlamydia infection Patient has completed treatment.  Partners were informed and treated.  She would like to complete STI screening with RPR and HIV which she declined at last visit.  Health maintenance Due for colonoscopy.  Agreeable to referral.   PERTINENT  PMH / PSH: HTN, Prediabetes  OBJECTIVE:   BP (!) 164/106   Pulse 79   Ht 5\' 5"  (1.651 m)   Wt 195 lb (88.5 kg)   LMP 06/28/2023 (Approximate)   SpO2 98%   BMI 32.45 kg/m    General: NAD, pleasant Cardio: RRR, no MRG. Respiratory: CTAB, normal wob on RA GI: Abdomen is soft, not tender, not distended. BS present.  Skin: Warm and dry. 1 cm x 1 cm non-tender, well circumscribed nodule on anterior abdomen.  ASSESSMENT/PLAN:   HTN (hypertension), benign Assessment & Plan: Not at goal 2/2 medication noncompliance.  Due to elevation greater than 160 systolic, will start dual therapy.  Follow-up in 2 months.  Orders: -     Losartan Potassium-HCTZ; Take 1 tablet by mouth daily.  Dispense: 30 tablet; Refill: 1  Anemia, unspecified type Assessment & Plan: Primary suspect iron deficiency from menorrhagia.  Will obtain ferritin.  Also due for colonoscopy. - 325 mg iron every other day  Orders: -     Ferritin  Screen for STD (sexually transmitted disease) -     RPR -     HIV Antibody (routine testing w  rflx)  Colon cancer screening -     Ambulatory referral to Gastroenterology  Epidermal Inclusion Cyst Suspect EIC. Plan for I&D at separate visit.  Tiffany Kocher, DO [facility]

## 2023-07-09 NOTE — Assessment & Plan Note (Addendum)
Primary suspect iron deficiency from menorrhagia.  Will obtain ferritin.  Also due for colonoscopy. - 325 mg iron every other day

## 2023-07-09 NOTE — Patient Instructions (Addendum)
It was great to see you! Thank you for allowing me to participate in your care!   I recommend that you always bring your medications to each appointment as this makes it easy to ensure we are on the correct medications and helps [place] not miss when refills are needed.  Our plans for today:  - Please take 1 tablet of Hyzaar daily - Schedule appointment at earliest convenience to remove cyst - Please schedule 2 month follow-up for blood pressure  We are checking some labs today, I will call you if they are abnormal will send you a MyChart message or a letter if they are normal.  If you do not hear about your labs in the next 2 weeks please let us know.  Take care and seek immediate care sooner if you develop any concerns. Please remember to show up 15 minutes before your scheduled appointment time!  Tiffany Kocher, DO [facility]

## 2023-07-09 NOTE — Assessment & Plan Note (Signed)
Not at goal 2/2 medication noncompliance.  Due to elevation greater than 160 systolic, will start dual therapy.  Follow-up in 2 months.

## 2023-07-10 LAB — RPR: RPR Ser Ql: NONREACTIVE

## 2023-07-10 LAB — FERRITIN: Ferritin: 27 ng/mL (ref 15–150)

## 2023-07-10 LAB — HIV ANTIBODY (ROUTINE TESTING W REFLEX): HIV Screen 4th Generation wRfx: NONREACTIVE

## 2023-07-12 ENCOUNTER — Telehealth: Payer: Self-pay

## 2023-07-12 NOTE — Progress Notes (Signed)
 Patient attempted to be outreached by Erasmo Leventhal, PharmD Candidate to discuss hypertension. Left voicemail for patient to return our call at their convenience at [phone]

## 2023-07-19 ENCOUNTER — Encounter: Payer: Self-pay | Admitting: Student

## 2023-07-19 ENCOUNTER — Ambulatory Visit (INDEPENDENT_AMBULATORY_CARE_PROVIDER_SITE_OTHER): Payer: Commercial Managed Care - PPO | Admitting: Student

## 2023-07-19 VITALS — BP 136/68 | HR 81 | Ht 65.0 in | Wt 195.1 lb

## 2023-07-19 DIAGNOSIS — L720 Epidermal cyst: Secondary | ICD-10-CM

## 2023-07-19 NOTE — Patient Instructions (Signed)
It was great to see you! Thank you for allowing me to participate in your care!   Post Incision and drainage care instructions  Wound Care: The wound should be kept clean and dry. Patients are often advised to change dressings daily or as needed if they become wet or soiled. Gentle cleaning with soap and water is usually sufficient, and the use of antiseptic solutions may be recommended in some cases.  Pain Management: Over-the-counter pain medications such as acetaminophen or ibuprofen are generally recommended for pain control. Stronger analgesics may be prescribed if necessary.  Activity Restrictions: Patients should avoid strenuous activities that could stress the wound site for the next 24-48 hours.  Signs of Infection: Patients should be educated on recognizing signs of infection, such as increased redness, swelling, warmth, pain, or discharge from the wound, and advised to seek medical attention if these occur.  Take care and seek immediate care sooner if you develop any concerns. Please remember to show up 15 minutes before your scheduled appointment time!  Tiffany Kocher, DO [facility]

## 2023-07-19 NOTE — Progress Notes (Signed)
    SUBJECTIVE:   CHIEF COMPLAINT / HPI:   Lesion on abdomen  Lesion present for 2 years and growing. Not painful but skin overlying lesion can be irritated. Reports no drainage from site. Would like excision today.  OBJECTIVE:   BP 136/68   Pulse 81   Ht 5\' 5"  (1.651 m)   Wt 195 lb 2 oz (88.5 kg)   LMP 06/28/2023 (Approximate)   SpO2 100%   BMI 32.47 kg/m    General: NAD, well-appearing, well-nourished Respiratory: No respiratory distress, breathing comfortably, able to speak in full sentences Skin: warm and dry, no rashes noted on exposed skin Psych: Appropriate affect and mood  ASSESSMENT/PLAN:   Epidermal inclusion cyst 1 cm x 1 cm lesion on abdomen. Suspect epidermal inclusion cyst. Excision performed, see procedure note below. Sample of cyst wall sent for pathology.   Excision of cyst Discussed risks/benefits of procedure, and patient gave consent for excision of cyst.  Prepped skin in usual sterile fashion.  Used 1.2 cc of 1% lidocaine with epinephrine for local anesthesia, #11 blade to make a linear incision, and forceps to break up consolidations. Drained thick purulent fluid. Cyst wall was identified and complete removal was successful. Sample sent to pathology. ~1 cm incision site, with good hemostasis. Two steri-strips placed. Patient tolerated procedure well.  Discussed wound care.  Tiffany Kocher, DO [facility]

## 2023-07-19 NOTE — Assessment & Plan Note (Signed)
1 cm x 1 cm lesion on abdomen. Suspect epidermal inclusion cyst. Excision performed, see procedure note below. Sample of cyst wall sent for pathology.

## 2023-07-24 ENCOUNTER — Other Ambulatory Visit (HOSPITAL_COMMUNITY): Payer: Self-pay

## 2023-07-25 ENCOUNTER — Encounter: Payer: Self-pay | Admitting: Gastroenterology

## 2023-08-02 ENCOUNTER — Ambulatory Visit (AMBULATORY_SURGERY_CENTER): Payer: Commercial Managed Care - PPO | Admitting: *Deleted

## 2023-08-02 ENCOUNTER — Other Ambulatory Visit (HOSPITAL_COMMUNITY): Payer: Self-pay

## 2023-08-02 VITALS — Ht 65.0 in | Wt 195.0 lb

## 2023-08-02 DIAGNOSIS — Z1211 Encounter for screening for malignant neoplasm of colon: Secondary | ICD-10-CM

## 2023-08-02 MED ORDER — NA SULFATE-K SULFATE-MG SULF 17.5-3.13-1.6 GM/177ML PO SOLN
1.0000 | Freq: Once | ORAL | 0 refills | Status: AC
Start: 2023-08-02 — End: 2023-08-03
  Filled 2023-08-02: qty 354, 2d supply, fill #0

## 2023-08-02 NOTE — Progress Notes (Signed)
Pt's previsit is done over the phone and all paperwork (prep instructions) sent to patient.  Pt's name and DOB verified at the beginning of the previsit.  Pt denies any difficulty with ambulating.   No egg or soy allergy known to patient  No issues known to pt with past sedation with any surgeries or procedures Patient denies ever being told they had issues or difficulty with intubation  No FH of Malignant Hyperthermia Pt is not on diet pills Pt is not on  home 02  Pt is not on blood thinners  Pt denies issues with constipation  Pt is not on dialysis Pt denies any upcoming cardiac testing Pt encouraged to use to use Singlecare or Goodrx to reduce cost  Patient's chart reviewed by Cathlyn Parsons CNRA prior to previsit and patient appropriate for the [facility].  Previsit completed and red dot placed by patient's name on their procedure day (on provider's schedule).

## 2023-08-12 ENCOUNTER — Encounter: Payer: Self-pay | Admitting: Gastroenterology

## 2023-08-23 ENCOUNTER — Encounter: Payer: Self-pay | Admitting: Gastroenterology

## 2023-08-23 ENCOUNTER — Ambulatory Visit (AMBULATORY_SURGERY_CENTER): Payer: Commercial Managed Care - PPO | Admitting: Gastroenterology

## 2023-08-23 VITALS — BP 118/61 | HR 55 | Temp 96.0°F | Resp 11 | Ht 65.0 in | Wt 195.0 lb

## 2023-08-23 DIAGNOSIS — F419 Anxiety disorder, unspecified: Secondary | ICD-10-CM | POA: Diagnosis not present

## 2023-08-23 DIAGNOSIS — Z1211 Encounter for screening for malignant neoplasm of colon: Secondary | ICD-10-CM | POA: Diagnosis not present

## 2023-08-23 DIAGNOSIS — I10 Essential (primary) hypertension: Secondary | ICD-10-CM | POA: Diagnosis not present

## 2023-08-23 HISTORY — PX: COLONOSCOPY: SHX174

## 2023-08-23 MED ORDER — SODIUM CHLORIDE 0.9 % IV SOLN: 500.0000 mL | INTRAVENOUS | Status: DC

## 2023-08-23 NOTE — Op Note (Signed)
[facility] Patient Name: Alexandra Benjamin Procedure Date: 08/23/2023 2:38 PM MRN: [id number] Endoscopist: Lorin Picket E. Tomasa Rand , MD, [id number] Age: 47 Referring MD:  Date of Birth: 12/20/1975 Gender: Female Account #: [id number] Procedure:                Colonoscopy Indications:              Screening for colorectal malignant neoplasm, This                            is the patient's first colonoscopy Medicines:                Monitored Anesthesia Care Procedure:                Pre-Anesthesia Assessment:                           - Prior to the procedure, a History and Physical                            was performed, and patient medications and                            allergies were reviewed. The patient's tolerance of                            previous anesthesia was also reviewed. The risks                            and benefits of the procedure and the sedation                            options and risks were discussed with the patient.                            All questions were answered, and informed consent                            was obtained. Prior Anticoagulants: The patient has                            taken no anticoagulant or antiplatelet agents. ASA                            Grade Assessment: II - A patient with mild systemic                            disease. After reviewing the risks and benefits,                            the patient was deemed in satisfactory condition to                            undergo the procedure.  After obtaining informed consent, the colonoscope                            was passed under direct vision. Throughout the                            procedure, the patient's blood pressure, pulse, and                            oxygen saturations were monitored continuously. The                            CF [id number] #[id number] was introduced through the anus                            and advanced to  the the terminal ileum, with                            identification of the appendiceal orifice and IC                            valve. The colonoscopy was performed without                            difficulty. The patient tolerated the procedure                            well. The quality of the bowel preparation was                            excellent. The terminal ileum, ileocecal valve,                            appendiceal orifice, and rectum were photographed.                            The bowel preparation used was SUPREP via split                            dose instruction. Scope In: 2:49:36 PM Scope Out: 3:00:51 PM Scope Withdrawal Time: 0 hours 7 minutes 6 seconds  Total Procedure Duration: 0 hours 11 minutes 15 seconds  Findings:                 The perianal and digital rectal examinations were                            normal. Pertinent negatives include normal                            sphincter tone and no palpable rectal lesions.                           The colon (entire examined portion) appeared normal.  The terminal ileum appeared normal.                           The retroflexed view of the distal rectum and anal                            verge was normal and showed no anal or rectal                            abnormalities. Complications:            No immediate complications. Estimated Blood Loss:     Estimated blood loss: none. Impression:               - The entire examined colon is normal.                           - The examined portion of the ileum was normal.                           - The distal rectum and anal verge are normal on                            retroflexion view.                           - No specimens collected. Recommendation:           - Patient has a contact number available for                            emergencies. The signs and symptoms of potential                            delayed complications  were discussed with the                            patient. Return to normal activities tomorrow.                            Written discharge instructions were provided to the                            patient.                           - Resume previous diet.                           - Continue present medications.                           - Repeat colonoscopy in 10 years for screening                            purposes. Rodriguez Aguinaldo E. Tomasa Rand, MD 08/23/2023 3:10:28 PM This report has been signed electronically.

## 2023-08-23 NOTE — Patient Instructions (Signed)

## 2023-08-23 NOTE — Progress Notes (Signed)
[facility] History and Physical   Primary Care Physician:  Tiffany Kocher, DO   Reason for Procedure:   Colon cancer screening  Plan:    Screening colonoscopy     HPI: Alexandra Benjamin is a 47 y.o. female undergoing initial average risk screening colonoscopy.  She has no family history of colon cancer and no chronic GI symptoms.    Past Medical History:  Diagnosis Date   Anxiety    Exposure to sexually transmitted disease (STD) 09/25/2019   Hypertension    Thyroid disease    Graves disease    Past Surgical History:  Procedure Laterality Date   eyelid surgery     FINGER FRACTURE SURGERY     TUBAL LIGATION      Prior to Admission medications   Medication Sig Start Date End Date Taking? Authorizing Provider  losartan-hydrochlorothiazide (HYZAAR) 50-12.5 MG tablet Take 1 tablet by mouth daily. 07/09/23  Yes Tiffany Kocher, DO  doxycycline (VIBRA-TABS) 100 MG tablet Take 1 tablet (100 mg total) by mouth 2 (two) times daily. Patient not taking: Reported on 08/23/2023 06/25/23   Tiffany Kocher, DO  escitalopram (LEXAPRO) 20 MG tablet Take 1/2 tablet by mouth for 1 week then 1 tablet (20 mg total) by mouth daily thereafter Patient not taking: Reported on 08/02/2023 07/28/21   Reece Leader, DO  Ferrous Sulfate (IRON) 325 (65 Fe) MG TABS Take by mouth daily.    [provider]  Lidocaine (HM LIDOCAINE PATCH) 4 % PTCH Apply 1 patch topically every 12 (twelve) hours as needed. Patient not taking: Reported on 08/23/2023 05/21/21   Haskel Schroeder, PA-C  methocarbamol (ROBAXIN) 500 MG tablet Take 1 tablet (500 mg total) by mouth every 8 (eight) hours as needed for muscle spasms. Patient not taking: Reported on 08/23/2023 10/19/21   Katha Cabal, DO  metroNIDAZOLE (FLAGYL) 500 MG tablet Take 1 tablet (500 mg total) by mouth 2 (two) times daily. Patient not taking: Reported on 08/23/2023 06/21/23   Tiffany Kocher, DO  ondansetron (ZOFRAN ODT) 4 MG disintegrating  tablet Take 1 tablet (4 mg total) by mouth every 8 (eight) hours as needed for nausea or vomiting. Patient not taking: Reported on 06/06/2021 02/23/21   Dietrich Pates, PA-C    Current Outpatient Medications  Medication Sig Dispense Refill   losartan-hydrochlorothiazide (HYZAAR) 50-12.5 MG tablet Take 1 tablet by mouth daily. 30 tablet 1   doxycycline (VIBRA-TABS) 100 MG tablet Take 1 tablet (100 mg total) by mouth 2 (two) times daily. (Patient not taking: Reported on 08/23/2023) 20 tablet 0   escitalopram (LEXAPRO) 20 MG tablet Take 1/2 tablet by mouth for 1 week then 1 tablet (20 mg total) by mouth daily thereafter (Patient not taking: Reported on 08/02/2023) 30 tablet 1   Ferrous Sulfate (IRON) 325 (65 Fe) MG TABS Take by mouth daily.     Lidocaine (HM LIDOCAINE PATCH) 4 % PTCH Apply 1 patch topically every 12 (twelve) hours as needed. (Patient not taking: Reported on 08/23/2023) 30 patch 0   methocarbamol (ROBAXIN) 500 MG tablet Take 1 tablet (500 mg total) by mouth every 8 (eight) hours as needed for muscle spasms. (Patient not taking: Reported on 08/23/2023) 30 tablet 1   metroNIDAZOLE (FLAGYL) 500 MG tablet Take 1 tablet (500 mg total) by mouth 2 (two) times daily. (Patient not taking: Reported on 08/23/2023) 21 tablet 0   ondansetron (ZOFRAN ODT) 4 MG disintegrating tablet Take 1 tablet (4 mg total) by mouth every 8 (eight) hours as  needed for nausea or vomiting. (Patient not taking: Reported on 06/06/2021) 4 tablet 0   Current Facility-Administered Medications  Medication Dose Route Frequency Provider Last Rate Last Admin   0.9 %  sodium chloride infusion  500 mL Intravenous Continuous Jenel Lucks, MD        Allergies as of 08/23/2023 - Review Complete 08/23/2023  Allergen Reaction Noted   Other  02/23/2021    Family History  Problem Relation Age of Onset   Heart disease Father    Stomach cancer Maternal Grandmother    Diabetes Maternal Grandmother    Diabetes Paternal Grandmother     Colon cancer Neg Hx    Esophageal cancer Neg Hx    Rectal cancer Neg Hx     Social History   Socioeconomic History   Marital status: Legally Separated    Spouse name: Not on file   Number of children: Not on file   Years of education: Not on file   Highest education level: Bachelor's degree (e.g., BA, AB, BS)  Occupational History   Not on file  Tobacco Use   Smoking status: Former   Smokeless tobacco: Never  Vaping Use   Vaping status: Never Used  Substance and Sexual Activity   Alcohol use: Yes    Alcohol/week: 1.0 - 2.0 standard drink of alcohol    Types: 1 - 2 Glasses of wine per week   Drug use: No   Sexual activity: Yes    Birth control/protection: Surgical  Other Topics Concern   Not on file  Social History Narrative   Not on file   Social Determinants of Health   Financial Resource Strain: Low Risk  (07/18/2023)   Overall Financial Resource Strain (CARDIA)    Difficulty of Paying Living Expenses: Not hard at all  Food Insecurity: No Food Insecurity (07/18/2023)   Hunger Vital Sign    Worried About Running Out of Food in the Last Year: Never true    Ran Out of Food in the Last Year: Never true  Transportation Needs: No Transportation Needs (07/18/2023)   PRAPARE - Administrator, Civil Service (Medical): No    Lack of Transportation (Non-Medical): No  Physical Activity: Insufficiently Active (07/18/2023)   Exercise Vital Sign    Days of Exercise per Week: 4 days    Minutes of Exercise per Session: 20 min  Stress: No Stress Concern Present (07/18/2023)   [employer] of Occupational Health - Occupational Stress Questionnaire    Feeling of Stress : Only a little  Social Connections: Socially Isolated (07/18/2023)   Social Connection and Isolation Panel [NHANES]    Frequency of Communication with Friends and Family: More than three times a week    Frequency of Social Gatherings with Friends and Family: Three times a week    Attends Religious  Services: Never    Active Member of Clubs or Organizations: No    Attends Engineer, structural: Not on file    Marital Status: Separated  Intimate Partner Violence: Not on file    Review of Systems:  All other review of systems negative except as mentioned in the HPI.  Physical Exam: Vital signs BP (!) 147/107   Pulse 80   Temp (!) 96 F (35.6 C)   Ht 5\' 5"  (1.651 m)   Wt 195 lb (88.5 kg)   LMP 08/19/2023 (Exact Date)   SpO2 100%   BMI 32.45 kg/m   General:   Alert,  Well-developed, well-nourished,  pleasant and cooperative in NAD Airway:  Mallampati 2 Lungs:  Clear throughout to auscultation.   Heart:  Regular rate and rhythm; no murmurs, clicks, rubs,  or gallops. Abdomen:  Soft, nontender and nondistended. Normal bowel sounds.   Neuro/Psych:  Normal mood and affect. A and O x 3   Khamya Topp E. Tomasa Rand, MD [facility]

## 2023-08-23 NOTE — Progress Notes (Signed)
Sedate, gd SR, tolerated procedure well, VSS, report to RN 

## 2023-08-26 ENCOUNTER — Telehealth: Payer: Self-pay

## 2023-08-26 NOTE — Telephone Encounter (Signed)
  Follow up Call-     08/23/2023    1:48 PM  Call back number  Post procedure Call Back phone  # [phone]  Permission to leave phone message Yes     Patient questions:  Do you have a fever, pain , or abdominal swelling? No. Pain Score  0 *  Have you tolerated food without any problems? Yes  Have you been able to return to your normal activities? Yes  Do you have any questions about your discharge instructions: Diet   No. Medications  No. Follow up visit  No.  Do you have questions or concerns about your Care? No.  Actions: * If pain score is 4 or above: No action needed, pain <4.

## 2023-09-06 ENCOUNTER — Other Ambulatory Visit (HOSPITAL_COMMUNITY): Payer: Self-pay

## 2023-09-06 ENCOUNTER — Encounter: Payer: Self-pay | Admitting: Student

## 2023-09-06 ENCOUNTER — Ambulatory Visit (INDEPENDENT_AMBULATORY_CARE_PROVIDER_SITE_OTHER): Payer: Commercial Managed Care - PPO | Admitting: Student

## 2023-09-06 VITALS — BP 138/88 | HR 62 | Ht 65.0 in | Wt 199.0 lb

## 2023-09-06 DIAGNOSIS — I10 Essential (primary) hypertension: Secondary | ICD-10-CM | POA: Diagnosis not present

## 2023-09-06 DIAGNOSIS — F419 Anxiety disorder, unspecified: Secondary | ICD-10-CM

## 2023-09-06 DIAGNOSIS — D500 Iron deficiency anemia secondary to blood loss (chronic): Secondary | ICD-10-CM

## 2023-09-06 MED ORDER — ESCITALOPRAM OXALATE 10 MG PO TABS
10.0000 mg | ORAL_TABLET | Freq: Every day | ORAL | 1 refills | Status: DC
Start: 2023-09-06 — End: 2023-12-25
  Filled 2023-09-06: qty 30, 30d supply, fill #0
  Filled 2023-12-12: qty 30, 30d supply, fill #1

## 2023-09-06 NOTE — Progress Notes (Signed)
    SUBJECTIVE:   CHIEF COMPLAINT / HPI:   Hypertension Patient presents for her 4-week follow-up after starting blood pressure medications.  Started on losartan hydrochlorothiazide for elevated blood pressures greater than 160/100.  Reports compliance with medication no side effects.  Not checking blood pressure at home.  Asymptomatic and feeling well today.  Anxiety History of anxiety.  Patient used to take Lexapro but is been off this for many years.  States her anxiety is worse, multiple factors including home life and work.  Worries about many different things.  Had a panic attack approximately 2 weeks ago.  Interested in restarting medications today.  Anemia Suspected iron deficiency anemia from chronic blood loss related to menorrhagia.  Up-to-date on colonoscopy.  Would like CBC and ferritin rechecked today, reasonable.  OBJECTIVE:   BP 138/88   Pulse 62   Ht 5\' 5"  (1.651 m)   Wt 199 lb (90.3 kg)   LMP 08/19/2023 (Exact Date)   SpO2 99%   BMI 33.12 kg/m    General: NAD, pleasant Cardio: RRR, no MRG. Respiratory: CTAB, normal wob on RA GI: Abdomen is soft, not tender, not distended. BS present Skin: Warm and dry  ASSESSMENT/PLAN:   Assessment & Plan HTN (hypertension), benign In goal today.  Continue Hyzaar. - BMP Iron deficiency anemia due to chronic blood loss Compliant with iron supplementation. - CBC/ferritin Anxiety Suspect primary generalized anxiety disorder. - Start Lexapro 10 mg - Follow-up in 4 weeks   Tiffany Kocher, DO [facility]

## 2023-09-06 NOTE — Assessment & Plan Note (Signed)
In goal today.  Continue Hyzaar. - BMP

## 2023-09-06 NOTE — Assessment & Plan Note (Signed)
Compliant with iron supplementation. - CBC/ferritin

## 2023-09-06 NOTE — Patient Instructions (Signed)
It was great to see you! Thank you for allowing me to participate in your care!   I recommend that you always bring your medications to each appointment as this makes it easy to ensure we are on the correct medications and helps [place] not miss when refills are needed.  Our plans for today:  - Take 10 mg of Lexapro daily - Follow-up in 4 weeks  We are checking some labs today, I will call you if they are abnormal will send you a MyChart message or a letter if they are normal.  If you do not hear about your labs in the next 2 weeks please let us know.  Take care and seek immediate care sooner if you develop any concerns. Please remember to show up 15 minutes before your scheduled appointment time!  Tiffany Kocher, DO [facility]

## 2023-09-07 LAB — CBC WITH DIFFERENTIAL/PLATELET
Basophils Absolute: 0 10*3/uL (ref 0.0–0.2)
Basos: 1 %
EOS (ABSOLUTE): 0.1 10*3/uL (ref 0.0–0.4)
Eos: 2 %
Hematocrit: 33.5 % — ABNORMAL LOW (ref 34.0–46.6)
Hemoglobin: 10.5 g/dL — ABNORMAL LOW (ref 11.1–15.9)
Immature Grans (Abs): 0 10*3/uL (ref 0.0–0.1)
Immature Granulocytes: 0 %
Lymphocytes Absolute: 1.7 10*3/uL (ref 0.7–3.1)
Lymphs: 35 %
MCH: 26.3 pg — ABNORMAL LOW (ref 26.6–33.0)
MCHC: 31.3 g/dL — ABNORMAL LOW (ref 31.5–35.7)
MCV: 84 fL (ref 79–97)
Monocytes Absolute: 0.4 10*3/uL (ref 0.1–0.9)
Monocytes: 9 %
Neutrophils Absolute: 2.5 10*3/uL (ref 1.4–7.0)
Neutrophils: 53 %
Platelets: 321 10*3/uL (ref 150–450)
RBC: 3.99 x10E6/uL (ref 3.77–5.28)
RDW: 17.5 % — ABNORMAL HIGH (ref 11.7–15.4)
WBC: 4.8 10*3/uL (ref 3.4–10.8)

## 2023-09-07 LAB — BASIC METABOLIC PANEL
BUN/Creatinine Ratio: 17 (ref 9–23)
BUN: 15 mg/dL (ref 6–24)
CO2: 24 mmol/L (ref 20–29)
Calcium: 9.1 mg/dL (ref 8.7–10.2)
Chloride: 101 mmol/L (ref 96–106)
Creatinine, Ser: 0.87 mg/dL (ref 0.57–1.00)
Glucose: 96 mg/dL (ref 70–99)
Potassium: 4.4 mmol/L (ref 3.5–5.2)
Sodium: 139 mmol/L (ref 134–144)
eGFR: 83 mL/min/{1.73_m2} (ref >59)

## 2023-09-07 LAB — FERRITIN: Ferritin: 24 ng/mL (ref 15–150)

## 2023-09-17 ENCOUNTER — Other Ambulatory Visit (HOSPITAL_COMMUNITY): Payer: Self-pay

## 2023-09-17 ENCOUNTER — Other Ambulatory Visit: Payer: Self-pay | Admitting: Student

## 2023-09-17 DIAGNOSIS — I10 Essential (primary) hypertension: Secondary | ICD-10-CM

## 2023-09-17 MED ORDER — LOSARTAN POTASSIUM-HCTZ 50-12.5 MG PO TABS
1.0000 | ORAL_TABLET | Freq: Every day | ORAL | 1 refills | Status: DC
  Filled 2023-09-17 – 2023-09-27 (×2): qty 30, 30d supply, fill #0
  Filled 2023-11-11: qty 30, 30d supply, fill #1

## 2023-09-27 ENCOUNTER — Other Ambulatory Visit: Payer: Self-pay

## 2023-09-27 ENCOUNTER — Other Ambulatory Visit (HOSPITAL_COMMUNITY): Payer: Self-pay

## 2023-10-02 ENCOUNTER — Ambulatory Visit: Payer: Commercial Managed Care - PPO | Admitting: Family Medicine

## 2023-10-02 ENCOUNTER — Other Ambulatory Visit (HOSPITAL_COMMUNITY)
Admission: RE | Admit: 2023-10-02 | Discharge: 2023-10-02 | Disposition: A | Discharge status: Home / Self Care | Payer: Commercial Managed Care - PPO | Source: Ambulatory Visit | Attending: Family Medicine | Admitting: Family Medicine

## 2023-10-02 ENCOUNTER — Other Ambulatory Visit (HOSPITAL_COMMUNITY): Payer: Self-pay

## 2023-10-02 ENCOUNTER — Encounter: Payer: Self-pay | Admitting: Family Medicine

## 2023-10-02 VITALS — BP 136/97 | HR 64 | Ht 65.0 in | Wt 197.2 lb

## 2023-10-02 DIAGNOSIS — Z113 Encounter for screening for infections with a predominantly sexual mode of transmission: Secondary | ICD-10-CM

## 2023-10-02 DIAGNOSIS — B9689 Other specified bacterial agents as the cause of diseases classified elsewhere: Secondary | ICD-10-CM | POA: Insufficient documentation

## 2023-10-02 DIAGNOSIS — A599 Trichomoniasis, unspecified: Secondary | ICD-10-CM

## 2023-10-02 DIAGNOSIS — N760 Acute vaginitis: Secondary | ICD-10-CM | POA: Diagnosis not present

## 2023-10-02 LAB — POCT WET PREP (WET MOUNT)
Clue Cells Wet Prep Whiff POC: POSITIVE
Trichomonas Wet Prep HPF POC: ABSENT

## 2023-10-02 MED ORDER — METRONIDAZOLE 500 MG PO TABS
500.0000 mg | ORAL_TABLET | Freq: Two times a day (BID) | ORAL | 0 refills | Status: DC
  Filled 2023-10-02: qty 21, 11d supply, fill #0

## 2023-10-02 NOTE — Patient Instructions (Signed)
I will give you a call if any of your results from today are abnormal and require treatment.  Otherwise I will send a message on MyChart

## 2023-10-02 NOTE — Progress Notes (Unsigned)
    SUBJECTIVE:   CHIEF COMPLAINT / HPI:   Here for STD testing Patient states her current partner may have been exposed to trichomonas recently and may have may not be having symptoms. She denies any current symptoms including abnormal vaginal discharge, itching or pain, abdominal pain, fevers Negative HIV/syphilis in August with positive for trichomonas and chlamydia.  She was treated for this  PERTINENT  PMH / PSH: History of trichomonas and chlamydia in August  OBJECTIVE:   BP (!) 136/97   Pulse 64   Ht 5\' 5"  (1.651 m)   Wt 197 lb 4 oz (89.5 kg)   LMP 09/15/2023   SpO2 99%   BMI 32.82 kg/m   General: NAD, pleasant, able to participate in exam Respiratory: No respiratory distress Skin: warm and dry, no rashes noted Psych: Normal affect and mood  Pelvic exam: normal external genitalia, vulva, vagina, cervix, uterus and adnexa, no significant discharge or irritation noted, exam chaperoned by Clabe Seal CMA.  ASSESSMENT/PLAN:   Assessment & Plan Screen for STD (sexually transmitted disease) Patient declines HIV and syphilis testing today as this was recently done in August.  Wet mount showed***.  Will follow-up results of gonorrhea/chlamydia and treat as appropriate.   Vonna Drafts, MD [facility]

## 2023-10-04 LAB — CERVICOVAGINAL ANCILLARY ONLY
Bacterial Vaginitis (gardnerella): POSITIVE — AB
Candida Glabrata: NEGATIVE
Candida Vaginitis: NEGATIVE
Chlamydia: NEGATIVE
Comment: NEGATIVE
Comment: NEGATIVE
Comment: NEGATIVE
Comment: NEGATIVE
Comment: NEGATIVE
Comment: NORMAL
Neisseria Gonorrhea: NEGATIVE
Trichomonas: NEGATIVE

## 2023-10-09 ENCOUNTER — Ambulatory Visit: Payer: Commercial Managed Care - PPO | Admitting: Student

## 2023-10-09 ENCOUNTER — Encounter: Payer: Self-pay | Admitting: Student

## 2023-10-09 ENCOUNTER — Other Ambulatory Visit (HOSPITAL_COMMUNITY): Payer: Self-pay

## 2023-10-09 VITALS — BP 131/86 | HR 65 | Temp 99.0°F | Ht 65.0 in | Wt 194.0 lb

## 2023-10-09 DIAGNOSIS — N920 Excessive and frequent menstruation with regular cycle: Secondary | ICD-10-CM | POA: Diagnosis not present

## 2023-10-09 DIAGNOSIS — F419 Anxiety disorder, unspecified: Secondary | ICD-10-CM

## 2023-10-09 MED ORDER — NORGESTIMATE-ETH ESTRADIOL 0.25-35 MG-MCG PO TABS
1.0000 | ORAL_TABLET | Freq: Every day | ORAL | 11 refills | Status: DC
  Filled 2023-10-09: qty 28, 28d supply, fill #0
  Filled 2023-11-11: qty 28, 28d supply, fill #1
  Filled 2023-12-12: qty 28, 28d supply, fill #2

## 2023-10-09 NOTE — Patient Instructions (Signed)
It was great to see you! Thank you for allowing me to participate in your care!   I recommend that you always bring your medications to each appointment as this makes it easy to ensure we are on the correct medications and helps [place] not miss when refills are needed.  Our plans for today:  - Please take Sprintec to help with heavy bleeding. I recommend not skipping a period, and following the dosing provided by the packaging. - Please follow-up in 6 weeks   Take care and seek immediate care sooner if you develop any concerns. Please remember to show up 15 minutes before your scheduled appointment time!  Tiffany Kocher, DO [facility]

## 2023-10-09 NOTE — Progress Notes (Signed)
    SUBJECTIVE:   CHIEF COMPLAINT / HPI:   Anxiety Lexapro 10 mg at night d/t drowsiness.  Feels an improvement of her anxiety.  No other side effects.  Menstruation Still having period, every 21-28 days, heavy. 2 tampons, 10 pads per day for 3 days. Has anemia.  Prior tubal ligation. Not on OCPs. No hx f blood clot, no migraines, no smoking.  OBJECTIVE:   BP 131/86 (BP Location: Left Arm, Patient Position: Sitting, Cuff Size: Normal)   Pulse 65   Temp 99 F (37.2 C) (Oral)   Ht 5\' 5"  (1.651 m)   Wt 194 lb (88 kg)   LMP 09/15/2023 (Exact Date)   SpO2 100%   BMI 32.28 kg/m    General: NAD, pleasant Cardio: RRR, no MRG. Cap Refill <2s. Respiratory: CTAB, normal wob on RA GI: Abdomen is soft, not tender, not distended. BS present Skin: Warm and dry  ASSESSMENT/PLAN:   Assessment & Plan Anxiety Anxiety well-controlled with 10 mg Lexapro. - Continue 10 mg Lexapro nightly - Reassess at follow-up Menorrhagia with regular cycle Menorrhagia with iron deficiency anemia.  No contraindications to OCPs.  Counseled on risk and benefits, including possible mood changes. - Trial OCP Sprintec - Consider IUD - Consider transvaginal ultrasound - Follow-up in 6 weeks   Follow-up recommendations Assess for efficacy of OCP Inquire about anxiety Recommend CBC with ferritin in spring 2025   Tiffany Kocher, DO [facility]

## 2023-12-12 ENCOUNTER — Other Ambulatory Visit (HOSPITAL_COMMUNITY): Payer: Self-pay

## 2023-12-12 ENCOUNTER — Other Ambulatory Visit: Payer: Self-pay | Admitting: Student

## 2023-12-12 DIAGNOSIS — I10 Essential (primary) hypertension: Secondary | ICD-10-CM

## 2023-12-12 MED ORDER — LOSARTAN POTASSIUM-HCTZ 50-12.5 MG PO TABS
1.0000 | ORAL_TABLET | Freq: Every day | ORAL | 1 refills | Status: DC
  Filled 2023-12-12: qty 60, 60d supply, fill #0

## 2023-12-16 ENCOUNTER — Ambulatory Visit: Payer: Commercial Managed Care - PPO | Admitting: Student

## 2023-12-18 ENCOUNTER — Other Ambulatory Visit (HOSPITAL_COMMUNITY): Payer: Self-pay

## 2023-12-25 ENCOUNTER — Other Ambulatory Visit (HOSPITAL_COMMUNITY): Payer: Self-pay

## 2023-12-25 ENCOUNTER — Encounter: Payer: Self-pay | Admitting: Student

## 2023-12-25 ENCOUNTER — Ambulatory Visit (INDEPENDENT_AMBULATORY_CARE_PROVIDER_SITE_OTHER): Payer: Commercial Managed Care - PPO | Admitting: Student

## 2023-12-25 VITALS — BP 160/90 | HR 62 | Ht 65.0 in | Wt 184.1 lb

## 2023-12-25 DIAGNOSIS — R634 Abnormal weight loss: Secondary | ICD-10-CM | POA: Diagnosis not present

## 2023-12-25 DIAGNOSIS — I10 Essential (primary) hypertension: Secondary | ICD-10-CM

## 2023-12-25 DIAGNOSIS — N920 Excessive and frequent menstruation with regular cycle: Secondary | ICD-10-CM

## 2023-12-25 DIAGNOSIS — R1013 Epigastric pain: Secondary | ICD-10-CM | POA: Diagnosis not present

## 2023-12-25 LAB — POCT GLYCOSYLATED HEMOGLOBIN (HGB A1C): HbA1c, POC (prediabetic range): 5.7 % (ref 5.7–6.4)

## 2023-12-25 MED ORDER — PANTOPRAZOLE SODIUM 40 MG PO TBEC
40.0000 mg | DELAYED_RELEASE_TABLET | Freq: Every day | ORAL | 3 refills | Status: DC
  Filled 2023-12-25: qty 30, 30d supply, fill #0
  Filled 2024-01-23: qty 30, 30d supply, fill #1
  Filled 2024-02-25 – 2024-03-10 (×2): qty 30, 30d supply, fill #2
  Filled 2024-04-15: qty 30, 30d supply, fill #3

## 2023-12-25 NOTE — Patient Instructions (Addendum)
 It was great to see you! Thank you for allowing me to participate in your care!   I recommend that you always bring your medications to each appointment as this makes it easy to ensure we are on the correct medications and helps us  not miss when refills are needed.  Our plans for today:  -Take 5 mg of Lexapro  for 1 week, then discontinue -Please take pantoprazole  40 mg tablet daily for GERD -Please discontinue Sprintec .  We can discuss other options for heavy periods at your next appointment. -We are checking some labs today, I will call you if they are abnormal -Please follow-up with me in 2-4 weeks, if you have not been improvement by this time we can consider further testing-which may include a referral to GI for EGD  Take care and seek immediate care sooner if you develop any concerns. Please remember to show up 15 minutes before your scheduled appointment time!  Gladis Church, DO [facility]

## 2023-12-25 NOTE — Assessment & Plan Note (Signed)
 Uncontrolled today, previously well-controlled on Hyzaar .  Recently started Sprintec , suspect this may be causing elevated blood pressures. - Discontinue Sprintec  - Continue Hyzaar  50-12.5 mg daily

## 2023-12-25 NOTE — Progress Notes (Signed)
    SUBJECTIVE:   CHIEF COMPLAINT / HPI:   Unintentional weight loss  Diarrhea Patient has lost 15 lbs since October (~5 month period) which is 7% of intitial weight. She usually fluctuates between 199 and low 200's prior to now.  Notable changes include starting Lexapro  in October.  She has not changed her diet significantly, however has had significantly reduced appetite-as well as early satiety.  Her anxiety and stress are well-managed with her Lexapro .  Unfortunately, she has night sweats 1-2 nights per week which needs to change her clothing (ongoing for approximately 2-3 months).  She is also experiencing dyspepsia, GERD symptoms that have worsened over the last 2 to 3 months.  She takes Tums with minimal improvement.  Additionally she has diarrhea 3-4 days/week.  She also notes increased swelling of her ankles. No personal hx of cancer. Maternal grandmother with hx of gastric cancer. No myalgias/arthralgias. History of smoking black and mild's for 5-6 years- not currently smoking.  Hypertension Previously well-controlled hypertension.  She recently started Sprintec  and November 2024 for menorrhagia.  Unfortunately blood pressure is now out of goal.  She reports compliance with her Hyzaar .  Anxiety Well-controlled with Lexapro .  Suspect diarrhea may be side effect from Lexapro .  Menorrhagia Significant improvement of menorrhagia with Sprintec .   OBJECTIVE:   Pulse 62   Ht 5' 5 (1.651 m)   Wt 184 lb 2 oz (83.5 kg)   SpO2 100%   BMI 30.64 kg/m    General: NAD, pleasant Cardio: RRR, no MRG. Cap Refill <2s. Respiratory: CTAB, normal wob on RA GI: Abdomen is soft, not tender, not distended. BS present Skin: Warm and dry  ASSESSMENT/PLAN:   Assessment & Plan Unintentional weight loss 7% weight loss in <6 months. Not explained by diet and exercise. Associated symptoms: Diarrhea, night sweats, dyspepsia, early satiety, ankle swelling. Differential is broad and includes:  Medication side effect (Lexapro ), new onset diabetes, hyperthyroidism (history of Graves' disease), iron deficiency anemia (known history), renal/liver disease, malignancy. -CBC, CMP, TSH, A1c -Prilosec 40 mg for dyspepsia -Discontinue Lexapro , taper 5 mg weekly then stop -Follow-up in 2-4 weeks -Based on risk factors and symptoms, if not improving with management above would consider referral to GI for EGD.  Would also consider chest x-ray given prior smoking history. HTN (hypertension), benign Uncontrolled today, previously well-controlled on Hyzaar .  Recently started Sprintec , suspect this may be causing elevated blood pressures. - Discontinue Sprintec  - Continue Hyzaar  50-12.5 mg daily Menorrhagia with regular cycle Discontinue Sprintec  as above.  Will discuss other options including IUD at separate visit.   Gladis Church, DO [facility]

## 2023-12-26 ENCOUNTER — Telehealth: Payer: Self-pay | Admitting: Student

## 2023-12-26 ENCOUNTER — Encounter: Payer: Self-pay | Admitting: Student

## 2023-12-26 DIAGNOSIS — R634 Abnormal weight loss: Secondary | ICD-10-CM

## 2023-12-26 LAB — COMPREHENSIVE METABOLIC PANEL
ALT: 25 [IU]/L (ref 0–32)
AST: 31 [IU]/L (ref 0–40)
Albumin: 4.4 g/dL (ref 3.9–4.9)
Alkaline Phosphatase: 50 [IU]/L (ref 44–121)
BUN/Creatinine Ratio: 10 (ref 9–23)
BUN: 9 mg/dL (ref 6–24)
Bilirubin Total: 0.2 mg/dL (ref 0.0–1.2)
CO2: 22 mmol/L (ref 20–29)
Calcium: 9.5 mg/dL (ref 8.7–10.2)
Chloride: 101 mmol/L (ref 96–106)
Creatinine, Ser: 0.86 mg/dL (ref 0.57–1.00)
Globulin, Total: 3.2 g/dL (ref 1.5–4.5)
Glucose: 108 mg/dL — ABNORMAL HIGH (ref 70–99)
Potassium: 4.1 mmol/L (ref 3.5–5.2)
Sodium: 137 mmol/L (ref 134–144)
Total Protein: 7.6 g/dL (ref 6.0–8.5)
eGFR: 84 mL/min/{1.73_m2} (ref >59)

## 2023-12-26 LAB — CBC WITH DIFFERENTIAL/PLATELET
Basophils Absolute: 0 10*3/uL (ref 0.0–0.2)
Basos: 1 %
EOS (ABSOLUTE): 0.1 10*3/uL (ref 0.0–0.4)
Eos: 2 %
Hematocrit: 37.4 % (ref 34.0–46.6)
Hemoglobin: 12.1 g/dL (ref 11.1–15.9)
Immature Grans (Abs): 0 10*3/uL (ref 0.0–0.1)
Immature Granulocytes: 0 %
Lymphocytes Absolute: 1.4 10*3/uL (ref 0.7–3.1)
Lymphs: 34 %
MCH: 27.3 pg (ref 26.6–33.0)
MCHC: 32.4 g/dL (ref 31.5–35.7)
MCV: 84 fL (ref 79–97)
Monocytes Absolute: 0.4 10*3/uL (ref 0.1–0.9)
Monocytes: 10 %
Neutrophils Absolute: 2.1 10*3/uL (ref 1.4–7.0)
Neutrophils: 53 %
Platelets: 347 10*3/uL (ref 150–450)
RBC: 4.43 x10E6/uL (ref 3.77–5.28)
RDW: 13.9 % (ref 11.7–15.4)
WBC: 4 10*3/uL (ref 3.4–10.8)

## 2023-12-26 LAB — TSH RFX ON ABNORMAL TO FREE T4: TSH: 2.63 u[IU]/mL (ref 0.450–4.500)

## 2023-12-26 NOTE — Addendum Note (Signed)
 Addended by: Lavada Porteous on: 12/26/2023 02:04 PM   Modules accepted: Orders

## 2023-12-26 NOTE — Telephone Encounter (Signed)
 Called patient.  X 2.  HIPAA compliant left voicemail.  If patient returns call please inform:  Laboratory work looks normal.  Given her history of smoking, I am going to order an x-ray for her.  She can get this done at [facility] imaging at any time.  Additionally, due to the current waiting list to see GI I plan to place a GI referral now.  If her symptoms resolved before she sees GI, she can always cancel the appointment but I would rather have the appointment now.  If she has any other questions that she like to speak with me about, I am happy to give her a call back.

## 2023-12-27 ENCOUNTER — Telehealth: Payer: Self-pay | Admitting: Student

## 2023-12-27 NOTE — Telephone Encounter (Signed)
 Pt informed and agreeable.  She would like Dr. Telford Feather to give her a call back. Macario Savin, CMA

## 2023-12-27 NOTE — Telephone Encounter (Signed)
 Attempted to call patient. No answer.  Left HIPAA compliant voicemail.  Will touch base with her on Monday.

## 2024-01-14 ENCOUNTER — Ambulatory Visit
Admission: RE | Admit: 2024-01-14 | Discharge: 2024-01-14 | Disposition: A | Discharge status: Home / Self Care | Payer: Commercial Managed Care - PPO | Source: Ambulatory Visit | Attending: Family Medicine | Admitting: Family Medicine

## 2024-01-14 DIAGNOSIS — R634 Abnormal weight loss: Secondary | ICD-10-CM

## 2024-01-15 ENCOUNTER — Encounter: Payer: Self-pay | Admitting: Student

## 2024-01-17 ENCOUNTER — Ambulatory Visit: Payer: Commercial Managed Care - PPO

## 2024-01-27 ENCOUNTER — Ambulatory Visit (INDEPENDENT_AMBULATORY_CARE_PROVIDER_SITE_OTHER): Payer: Commercial Managed Care - PPO | Admitting: Student

## 2024-01-27 ENCOUNTER — Other Ambulatory Visit (HOSPITAL_COMMUNITY): Payer: Self-pay

## 2024-01-27 ENCOUNTER — Encounter: Payer: Self-pay | Admitting: Student

## 2024-01-27 VITALS — BP 158/113 | HR 79 | Wt 189.2 lb

## 2024-01-27 DIAGNOSIS — I10 Essential (primary) hypertension: Secondary | ICD-10-CM | POA: Diagnosis not present

## 2024-01-27 DIAGNOSIS — N920 Excessive and frequent menstruation with regular cycle: Secondary | ICD-10-CM

## 2024-01-27 DIAGNOSIS — R634 Abnormal weight loss: Secondary | ICD-10-CM | POA: Diagnosis not present

## 2024-01-27 MED ORDER — LOSARTAN POTASSIUM-HCTZ 100-25 MG PO TABS
1.0000 | ORAL_TABLET | Freq: Every day | ORAL | 1 refills | Status: DC
  Filled 2024-01-27: qty 30, 30d supply, fill #0
  Filled 2024-02-25 – 2024-03-10 (×2): qty 30, 30d supply, fill #1
  Filled 2024-04-15: qty 30, 30d supply, fill #2
  Filled 2024-06-02 – 2024-06-15 (×2): qty 30, 30d supply, fill #3

## 2024-01-27 NOTE — Progress Notes (Signed)
    SUBJECTIVE:   CHIEF COMPLAINT / HPI:   Unintentional weight loss Stopped lexapro Started prilosec Overall dyspepsia is improved, but still having early satiety.  Weight is at increased 5 pounds today.  Appetite has increased now that she is off Lexapro.  I do suspect Lexapro was the cause of her poor appetite.  Chest x-ray was normal, lab work within normal limits.  Anemia has improved now that she is taking her iron supplementation.  Still think a GI referral is reasonable given her early satiety, however EGD may or may not be necessary if patient continues to improve with Prilosec.  Mennorhagia - Cycle normal - Heavy bleeds with clots - Pain -This improved on sprintec, but had to stop d/t HTN. -Interested in IUD  Hypertension Compliant with Hyzaar 50-12.5 mg.  Unfortunately, multiple visits now with elevated blood pressure, and patient is agreeable to increasing medication today.  Asymptomatic.   OBJECTIVE:   BP (!) 158/113   Pulse 79   Wt 189 lb 3.2 oz (85.8 kg)   SpO2 90%   BMI 31.48 kg/m    General: NAD, pleasant Cardio: RRR, no MRG. Cap Refill <2s. Respiratory: CTAB, normal wob on RA Skin: Warm and dry  ASSESSMENT/PLAN:   Assessment & Plan Unintentional weight loss Reassuringly gained weight.  CXR and lab work unremarkable. Suspect Lexapro caused significant appetite reduction.  Still having mild dyspepsia despite Prilosec, and early satiety. - Continue Prilosec - Follow-up with GI Menorrhagia with regular cycle Regular cycle, heavy clot burden with dysmenorrhea.  Well-controlled with Sprintec, however Sprintec caused worsened hypertension. - Plan for IUD, appointment to be scheduled by patient - Consider ultrasound and [facility] referral if patient continues to have heavy bleeding despite IUD HTN (hypertension), benign Poorly controlled.  Compliant with Hyzaar 50-12.5 mg daily.  Anxiety likely contributing slightly, however multiple visits now with elevated  pressures. - Increase Hyzaar to 100-25 mg daily - Follow-up in approximately 2 weeks for blood pressure recheck  Follow-up recommendations Discussed anxiety, Lexapro caused appetite reduction and weight loss.  Tiffany Kocher, DO [facility]

## 2024-01-27 NOTE — Assessment & Plan Note (Signed)
 Poorly controlled.  Compliant with Hyzaar 50-12.5 mg daily.  Anxiety likely contributing slightly, however multiple visits now with elevated pressures. - Increase Hyzaar to 100-25 mg daily - Follow-up in approximately 2 weeks for blood pressure recheck

## 2024-01-27 NOTE — Patient Instructions (Addendum)
 It was great to see you! Thank you for allowing me to participate in your care!   I recommend that you always bring your medications to each appointment as this makes it easy to ensure we are on the correct medications and helps [place] not miss when refills are needed.  Our plans for today:  - Continue the Prilosec  - Please call [facility] at [phone] to schedule your appointment - Schedule an appt with me for IUD, at your earliest convenience. Please remind the front desk this needs to be a 40 minute appointment - Also schedule a separate appointment to discuss anxiety and blood pressure  Take care and seek immediate care sooner if you develop any concerns. Please remember to show up 15 minutes before your scheduled appointment time!  Tiffany Kocher, DO [facility]

## 2024-01-28 ENCOUNTER — Other Ambulatory Visit: Payer: Self-pay

## 2024-01-28 ENCOUNTER — Encounter (HOSPITAL_COMMUNITY): Payer: Self-pay

## 2024-01-28 ENCOUNTER — Other Ambulatory Visit: Payer: Self-pay | Admitting: Family Medicine

## 2024-01-28 ENCOUNTER — Other Ambulatory Visit (HOSPITAL_COMMUNITY): Payer: Self-pay

## 2024-01-28 DIAGNOSIS — T148XXA Other injury of unspecified body region, initial encounter: Secondary | ICD-10-CM

## 2024-01-28 DIAGNOSIS — M5441 Lumbago with sciatica, right side: Secondary | ICD-10-CM

## 2024-02-10 ENCOUNTER — Ambulatory Visit: Admitting: Student

## 2024-02-19 ENCOUNTER — Ambulatory Visit: Payer: Self-pay | Admitting: Student

## 2024-02-19 ENCOUNTER — Encounter: Payer: Self-pay | Admitting: Student

## 2024-02-19 VITALS — BP 140/100 | HR 72 | Ht 65.0 in | Wt 188.5 lb

## 2024-02-19 DIAGNOSIS — Z975 Presence of (intrauterine) contraceptive device: Secondary | ICD-10-CM | POA: Insufficient documentation

## 2024-02-19 DIAGNOSIS — N920 Excessive and frequent menstruation with regular cycle: Secondary | ICD-10-CM | POA: Diagnosis not present

## 2024-02-19 DIAGNOSIS — Z30430 Encounter for insertion of intrauterine contraceptive device: Secondary | ICD-10-CM | POA: Diagnosis not present

## 2024-02-19 LAB — POCT URINE PREGNANCY: Preg Test, Ur: NEGATIVE

## 2024-02-19 NOTE — Assessment & Plan Note (Signed)
 Insertion: 4/2/20225 Expiration date: 02/19/2032

## 2024-02-19 NOTE — Patient Instructions (Addendum)
IUD AFTERCARE  1.  Uterine cramping is common after IUD placement.  You  May using heating pads, ibuprofen, and tylenol.  If it becomes very painful, you notice fever or chills, unusual bleeding, or foul smelling vaginal discharge please call the office. 2.  Irregular vaginal bleeding is common in the first few months after IUD placement but will often get better in the first six months.   3.  IUDs do not protect against STD such as HIV, genital warts, gonorrhea, chlamydia, herpes.  Please continue to protect yourself against these infections and contact the office if you think you may have contracted an infection. 4.  Checking for proper placement:  You may feel for your IUD strings as shown today by placing you fingers into your vagina.  Do not pull on the strings.  If your Mirena falls out, you can feel the hard plastic part of the IUD, or you can no longer feel the strings, please contact the office. 5.  Your Mirena should be removed or replaced in 5 years. 6.  Please see package insert or [email or web address] for full patient information. 7.  Make follow-up appointment for 4 weeks.  

## 2024-02-19 NOTE — Progress Notes (Addendum)
    SUBJECTIVE:   CHIEF COMPLAINT / HPI:   Menorrhagia Patient presents for IUD insertion today.  Discussed options for menorrhagia control, patient opted for IUD.  Could not tolerate OCPs due to hypertension reaction.  Patient has bilateral tubal ligation (01/12/2005), additionally urine pregnancy test is negative today- patient is reasonably not pregnant-will proceed with IUD placement.  OBJECTIVE:   BP (!) 140/100   Pulse 72   Ht 5\' 5"  (1.651 m)   Wt 188 lb 8 oz (85.5 kg)   LMP 01/27/2024   SpO2 97%   BMI 31.37 kg/m  Repeat BP: 140/80.  Urine pregnancy test: Negative  PROCEDURE NOTE: IUD INSERTION Patient given informed consent for IUD insertion of new IUD. Signed copy in the chart. Appropriate time out taken. Sterile technique used. Patient placed in the lithotomy position and the cervix brought into view using speculum. Cervix cleansed three times with  betadine swabs.  A tenaculum was placed into the anterior lip of the cervix and a uterine sound was used to measure uterine size. A Mirena IUD was placed into the endometrial cavity, deployed and secured. The applicator was removed. The strings were trimmed to 2 centimeters.  All equipment was removed and accounted for.There were no complications and the patient tolerated the procedure well.  She was given handouts for post procedure instructions and information about the IUD including a card with the time of recommended removal.   ASSESSMENT/PLAN:   Assessment & Plan Menorrhagia with regular cycle Unable to take OCP d/t HTN reaction despite success of treatment. Trial IUD.  -Mirena IUD Presence of Mirena IUD Insertion: 4/2/20225 Expiration date: 02/19/2032 Encounter for IUD insertion See procedure note above.    Tiffany Kocher, DO [facility]

## 2024-02-25 MED ORDER — LEVONORGESTREL 20 MCG/DAY IU IUD
1.0000 | INTRAUTERINE_SYSTEM | Freq: Once | INTRAUTERINE | Status: AC
Start: 2024-02-25 — End: 2024-02-19
  Administered 2024-02-19: 1 via INTRAUTERINE

## 2024-02-25 NOTE — Addendum Note (Signed)
 Addended by: Veronda Prude on: 02/25/2024 09:02 AM   Modules accepted: Orders

## 2024-02-26 ENCOUNTER — Ambulatory Visit (INDEPENDENT_AMBULATORY_CARE_PROVIDER_SITE_OTHER): Admitting: Student

## 2024-02-26 ENCOUNTER — Encounter: Payer: Self-pay | Admitting: Student

## 2024-02-26 VITALS — BP 130/90 | HR 85 | Ht 65.0 in | Wt 188.1 lb

## 2024-02-26 DIAGNOSIS — I10 Essential (primary) hypertension: Secondary | ICD-10-CM | POA: Diagnosis not present

## 2024-02-26 DIAGNOSIS — D500 Iron deficiency anemia secondary to blood loss (chronic): Secondary | ICD-10-CM | POA: Diagnosis not present

## 2024-02-26 NOTE — Assessment & Plan Note (Signed)
 IUD for menorrhagia, too soon to take effect-anticipate anemia will improve particular with stable hemoglobin (12.1) 2 months ago.  Will monitor closely.  Question if GI pathologies present, pending GI consultation this month. - Obtain ferritin - Continue p.o. iron

## 2024-02-26 NOTE — Assessment & Plan Note (Signed)
 Borderline elevated diastolic blood pressure.  Compliant with blood pressure medications.  Pressures better at home.  Using shared decision making would not make changes to regimen at this time. - Continue Hyzaar 100-25 mg daily

## 2024-02-26 NOTE — Progress Notes (Signed)
   SUBJECTIVE:   CHIEF COMPLAINT / HPI:   Hypertension  iron deficiency anemia  Patient reports compliance with her blood pressure medication.  Additionally, she is made lifestyle modifications including stress reduction (taking a vacation, changing environment), yoga classes, increasing physical activity and reducing caffeine intake.  She has a history of Graves', s/p treatment, not currently on Synthroid, TSH was normal in February 2025.  Iron deficiency anemia, likely 2/2 menorrhagia.  Just had IUD placed last week.  Had failed OCPs 2/2 medication induced hypertension.  Currently taking oral iron.  Patient would like iron checked.  She also has dyspepsia, and unexplained weight loss (now stabilized) which she was sent to GI for workup.  No significant NSAID use.  But given her dyspepsia and early satiety, I still believe would be reasonable to see GI for possible EGD.  OBJECTIVE:   BP (!) 130/90   Pulse 85   Ht 5\' 5"  (1.651 m)   Wt 188 lb 2 oz (85.3 kg)   LMP 01/27/2024   SpO2 99%   BMI 31.31 kg/m    General: NAD, pleasant Cardio: RRR, no MRG. Cap Refill <2s. Respiratory: CTAB, normal wob on RA Skin: Warm and dry  ASSESSMENT/PLAN:   Assessment & Plan Iron deficiency anemia due to chronic blood loss IUD for menorrhagia, too soon to take effect-anticipate anemia will improve particular with stable hemoglobin (12.1) 2 months ago.  Will monitor closely.  Question if GI pathologies present, pending GI consultation this month. - Obtain ferritin - Continue p.o. iron HTN (hypertension), benign Borderline elevated diastolic blood pressure.  Compliant with blood pressure medications.  Pressures better at home.  Using shared decision making would not make changes to regimen at this time. - Continue Hyzaar 100-25 mg daily  Tiffany Kocher, DO [facility]

## 2024-02-26 NOTE — Patient Instructions (Signed)
 It was great to see you! Thank you for allowing me to participate in your care!   I recommend that you always bring your medications to each appointment as this makes it easy to ensure we are on the correct medications and helps [place] not miss when refills are needed.  Our plans for today:  - Continue taking her iron pill and blood pressure medication - Follow-up in May for IUD check and to discuss anxiety -Follow-up with gastroenterology -We are checking some labs today, I will call you if they are abnormal will send you a MyChart message or a letter if they are normal.  If you do not hear about your labs in the next 2 weeks please let us know.  Take care and seek immediate care sooner if you develop any concerns. Please remember to show up 15 minutes before your scheduled appointment time!  Tiffany Kocher, DO [facility]

## 2024-02-27 LAB — FERRITIN: Ferritin: 45 ng/mL (ref 15–150)

## 2024-02-28 ENCOUNTER — Encounter: Payer: Self-pay | Admitting: Student

## 2024-03-09 ENCOUNTER — Other Ambulatory Visit (HOSPITAL_COMMUNITY): Payer: Self-pay

## 2024-03-25 ENCOUNTER — Encounter: Payer: Self-pay | Admitting: Gastroenterology

## 2024-03-25 ENCOUNTER — Ambulatory Visit: Admitting: Gastroenterology

## 2024-03-25 VITALS — BP 138/90 | HR 66 | Ht 65.0 in | Wt 196.0 lb

## 2024-03-25 DIAGNOSIS — R1310 Dysphagia, unspecified: Secondary | ICD-10-CM

## 2024-03-25 DIAGNOSIS — R634 Abnormal weight loss: Secondary | ICD-10-CM

## 2024-03-25 DIAGNOSIS — D509 Iron deficiency anemia, unspecified: Secondary | ICD-10-CM

## 2024-03-25 DIAGNOSIS — R1013 Epigastric pain: Secondary | ICD-10-CM

## 2024-03-25 DIAGNOSIS — R197 Diarrhea, unspecified: Secondary | ICD-10-CM

## 2024-03-25 DIAGNOSIS — K5900 Constipation, unspecified: Secondary | ICD-10-CM | POA: Diagnosis not present

## 2024-03-25 DIAGNOSIS — R198 Other specified symptoms and signs involving the digestive system and abdomen: Secondary | ICD-10-CM

## 2024-03-25 DIAGNOSIS — R1319 Other dysphagia: Secondary | ICD-10-CM
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
## 2024-03-25 NOTE — Patient Instructions (Addendum)
 You have been scheduled for an endoscopy. Please follow written instructions given to you at your visit today.  If you use inhalers (even only as needed), please bring them with you on the day of your procedure.  If you take any of the following medications, they will need to be adjusted prior to your procedure:   DO NOT TAKE 7 DAYS PRIOR TO TEST- Trulicity (dulaglutide) Ozempic, Wegovy (semaglutide) Mounjaro (tirzepatide) Bydureon Bcise (exanatide extended release)  DO NOT TAKE 1 DAY PRIOR TO YOUR TEST Rybelsus (semaglutide) Adlyxin (lixisenatide) Victoza (liraglutide) Byetta (exanatide)  Please purchase Metamucil over the counter. Take as directed.  ___________________________________________________________________________   _______________________________________________________  If your blood pressure at your visit was 140/90 or greater, please contact your primary care physician to follow up on this.  _______________________________________________________  If you are age 11 or older, your body mass index should be between 23-30. Your Body mass index is 32.62 kg/m. If this is out of the aforementioned range listed, please consider follow up with your Primary Care Provider.  If you are age 61 or younger, your body mass index should be between 19-25. Your Body mass index is 32.62 kg/m. If this is out of the aformentioned range listed, please consider follow up with your Primary Care Provider.   ________________________________________________________  The [facility] providers would like to encourage you to use MYCHART to communicate with providers for non-urgent requests or questions.  Due to long hold times on the telephone, sending your provider a message by [facility] may be a faster and more efficient way to get a response.  Please allow 48 business hours for a response.  Please remember that this is for non-urgent requests.   _______________________________________________________   It was a pleasure to see you today!  Thank you for trusting me with your gastrointestinal care!    It was a pleasure to see you today!  Thank you for trusting me with your gastrointestinal care!     Scott E.Cherryl Corona, MD

## 2024-03-26 ENCOUNTER — Encounter: Payer: Self-pay | Admitting: Student

## 2024-03-26 ENCOUNTER — Ambulatory Visit: Admitting: Student

## 2024-03-26 ENCOUNTER — Other Ambulatory Visit (HOSPITAL_COMMUNITY): Payer: Self-pay

## 2024-03-26 VITALS — BP 140/100 | HR 72 | Ht 65.0 in | Wt 195.4 lb

## 2024-03-26 DIAGNOSIS — M5441 Lumbago with sciatica, right side: Secondary | ICD-10-CM

## 2024-03-26 DIAGNOSIS — G479 Sleep disorder, unspecified: Secondary | ICD-10-CM

## 2024-03-26 DIAGNOSIS — Z975 Presence of (intrauterine) contraceptive device: Secondary | ICD-10-CM | POA: Diagnosis not present

## 2024-03-26 MED ORDER — METHOCARBAMOL 500 MG PO TABS
500.0000 mg | ORAL_TABLET | Freq: Three times a day (TID) | ORAL | 1 refills | Status: DC | PRN
  Filled 2024-03-26: qty 30, 10d supply, fill #0
  Filled 2024-04-15: qty 30, 10d supply, fill #1

## 2024-03-26 NOTE — Assessment & Plan Note (Signed)
 Strings checked today, and place.  Patient without concerning symptoms.

## 2024-03-26 NOTE — Progress Notes (Signed)
    SUBJECTIVE:   CHIEF COMPLAINT / HPI:   IUD string check IUD inserted on 02/19/2024.  Patient presents for evaluation of strings.  Acute on chronic right-sided low back pain Last few months pain has recurred. Right sided pain in low back that radiates down the leg. Taking Tylenol 500 mg twice daily. Using heating pads. Worse when sleeping on that side. No fevers, no urinary incontinence. No new injuries.  Does feel weakness on that side during pain. Pain can last hours.  Trouble sleeping Reports frequent awakenings during the night, typically around 3 AM.  Also having trouble falling asleep.  Bedtime is usually 10 PM, but takes at least an hour to fall asleep.  She does use screens prior to bedtime.  OBJECTIVE:   BP (!) 140/100   Pulse 72   Ht 5\' 5"  (1.651 m)   Wt 195 lb 6 oz (88.6 kg)   SpO2 99%   BMI 32.51 kg/m    General: NAD, pleasant Cardio: RRR, no MRG. Cap Refill <2s. Respiratory: CTAB, normal wob on RA Back: No gross Foley, no ecchymosis, no tenderness, no rash.  TTP over lumbar paraspinal muscles.  No TTP over spinous process.  Normal flexion and extension.  No TTP over piriformis. Straight leg positive FADIR/FABER negative for intra-articular pathology  ASSESSMENT/PLAN:   Assessment & Plan Right-sided low back pain with right-sided sciatica, unspecified chronicity Acute on chronic right low back pain with radiation to the leg.  Differential includes: DDD, sciatica, muscle strain. - Lumbar x-ray - Robaxin  3 times daily as needed - Follow-up in 6-8 weeks - If symptoms are improved, consider NSAIDs, physical therapy.  Consider advanced imaging pending x-ray results. Sleep disturbance Discussed proper sleep hygiene. - Follow-up in 6-8 weeks, improved Presence of Mirena  IUD Strings checked today, and place.  Patient without concerning symptoms.   Follow-up recommendations Elevated blood pressure, previously discussed medication adjustments at last visit-which  was deferred using shared decision making. Strongly recommend reevaluation of blood pressure at next visit.  Lavada Porteous, DO [facility]

## 2024-03-26 NOTE — Patient Instructions (Addendum)
 It was great to see you! Thank you for allowing me to participate in your care!   I recommend that you always bring your medications to each appointment as this makes it easy to ensure we are on the correct medications and helps us  not miss when refills are needed.  Our plans for today:  - Take 500 mg of Robaxin  3 times daily as needed for back pain -Please schedule an appointment upfront for next Friday for OMT with Dr. Rumball, I may be able to as well. -Follow-up in 6-8 weeks for your back pain -I recommend no screens 2 hours before bed, I recommend no fluids 2 hours before bed to prevent need to get up to urinate while sleeping. - An x-ray was ordered for you---you do not need an appointment to have this completed.  I recommend going to [facility] [address] [place]  If the results are normal,I will send you a letter  I will call you with results if anything is abnormal    Take care and seek immediate care sooner if you develop any concerns. Please remember to show up 15 minutes before your scheduled appointment time!  Lavada Porteous, DO [facility]

## 2024-04-03 ENCOUNTER — Ambulatory Visit: Admitting: Family Medicine

## 2024-04-03 ENCOUNTER — Encounter: Payer: Self-pay | Admitting: Family Medicine

## 2024-04-03 DIAGNOSIS — M545 Low back pain, unspecified: Secondary | ICD-10-CM | POA: Insufficient documentation

## 2024-04-03 DIAGNOSIS — M9904 Segmental and somatic dysfunction of sacral region: Secondary | ICD-10-CM

## 2024-04-03 DIAGNOSIS — M5441 Lumbago with sciatica, right side: Secondary | ICD-10-CM | POA: Diagnosis not present

## 2024-04-03 DIAGNOSIS — M9901 Segmental and somatic dysfunction of cervical region: Secondary | ICD-10-CM | POA: Diagnosis not present

## 2024-04-03 DIAGNOSIS — M9905 Segmental and somatic dysfunction of pelvic region: Secondary | ICD-10-CM

## 2024-04-03 DIAGNOSIS — M9903 Segmental and somatic dysfunction of lumbar region: Secondary | ICD-10-CM | POA: Diagnosis not present

## 2024-04-03 DIAGNOSIS — M9902 Segmental and somatic dysfunction of thoracic region: Secondary | ICD-10-CM | POA: Diagnosis not present
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
## 2024-04-03 NOTE — Patient Instructions (Signed)
 It was great to see you!  You received osteopathic manipulative treatment (OMT) today.   You may feel a little tired and sore after today's treatment. Make sure to stay hydrated. Continue to use tylenol, heating pad and muscle relaxer as needed for pain.   Make an appointment for another OMT treatment in a few weeks to a month.  Try the following stretches to help with keeping your muscles loose.  Take care and seek immediate care sooner if you develop any concerns.   Dr. Lavon Pound on your back. Bend your right knee so that your right foot is flat on the floor. Cross your left leg over your right so that your left ankle rests on your right knee. Use your hands to grab hold of your left knee and pull it gently toward the opposite shoulder. You should feel the stretch in your buttocks and hips. Hold for 15 to 30 seconds. Relax, and then repeat with the other leg. Repeat this cycle 2 to 4 times.   Lie on your back in a doorway, with one leg through the open door. Slide your leg up the wall to straighten your knee. You should feel a gentle stretch down the back of your leg. Hold the stretch for at least 1 minute. As the days go by, add a little more time until you can relax and let these muscles stretch for as much as 6 minutes for each leg. Do not arch your back. Do not bend either knee. Keep one heel touching the floor and the other heel touching the wall. Do not point your toes. Repeat with your other leg. Do 2 to 4 times for each leg. If you do not have a place to do this exercise in a doorway, there is another way to do it:  Lie on your back and bend the knee of the leg you want to stretch. Loop a towel under the ball and toes of that foot, and hold the ends of the towel in your hands. Straighten your knee and slowly pull back on the towel. You should feel a gentle stretch down the back of your leg. It is hard to hold this stretch with a towel for a long time, but hold the  stretch for at least 15 to 30 seconds. One minute or more is even better. Repeat with your other leg. Do 2 to 4 times for each leg.   Child's Pose Kneel on the floor with your toes together and your knees hip-width apart. Rest your palms on top of your thighs. On an exhale, lower your torso between your knees. Extend your arms alongside your torso with your palms facing down. Relax your shoulders toward the ground. Rest in the pose for as long as needed.

## 2024-04-03 NOTE — Assessment & Plan Note (Addendum)
 Likely MSK etiology with some somatic dysfunction contributing. Treated today with osteopathic manipulative treatment with good results as above. F/u 1 month for retreatment as needed.

## 2024-04-07 ENCOUNTER — Ambulatory Visit
Admission: RE | Admit: 2024-04-07 | Discharge: 2024-04-07 | Disposition: A | Discharge status: Home / Self Care | Source: Ambulatory Visit | Attending: Family Medicine | Admitting: Family Medicine

## 2024-04-07 DIAGNOSIS — M5416 Radiculopathy, lumbar region: Secondary | ICD-10-CM | POA: Diagnosis not present

## 2024-04-07 DIAGNOSIS — M5441 Lumbago with sciatica, right side: Secondary | ICD-10-CM

## 2024-04-16 ENCOUNTER — Telehealth: Payer: Self-pay | Admitting: Student

## 2024-04-16 NOTE — Telephone Encounter (Signed)
 Patient returns call to nurse line.   Results and plan discussed with patient.  Patient appreciative.

## 2024-04-16 NOTE — Telephone Encounter (Signed)
 Attempted to call patient, no answer.  Left HIPAA compliant voicemail.  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  Additionally, this is not a surgical problem nor an emergency.  I recommend she continue her current treatment plan with Robaxin  and OMT with Dr. Alverna John, I recommend she keep her appointment for 05/12/2024 with me.  If she has any immediate questions, she can schedule appointment or I can send her a MyChart message if she prefers.

## 2024-04-24 ENCOUNTER — Encounter: Payer: Self-pay | Admitting: Gastroenterology

## 2024-04-29 ENCOUNTER — Ambulatory Visit: Admitting: Family Medicine

## 2024-04-29 ENCOUNTER — Encounter: Payer: Self-pay | Admitting: Family Medicine

## 2024-04-29 DIAGNOSIS — M9901 Segmental and somatic dysfunction of cervical region: Secondary | ICD-10-CM

## 2024-04-29 DIAGNOSIS — M5441 Lumbago with sciatica, right side: Secondary | ICD-10-CM | POA: Diagnosis not present

## 2024-04-29 DIAGNOSIS — M9903 Segmental and somatic dysfunction of lumbar region: Secondary | ICD-10-CM | POA: Diagnosis not present

## 2024-04-29 DIAGNOSIS — M9902 Segmental and somatic dysfunction of thoracic region: Secondary | ICD-10-CM

## 2024-04-29 DIAGNOSIS — M9905 Segmental and somatic dysfunction of pelvic region: Secondary | ICD-10-CM | POA: Diagnosis not present

## 2024-04-29 DIAGNOSIS — G8929 Other chronic pain: Secondary | ICD-10-CM | POA: Diagnosis not present

## 2024-04-29 DIAGNOSIS — M9900 Segmental and somatic dysfunction of head region: Secondary | ICD-10-CM | POA: Diagnosis not present

## 2024-04-29 NOTE — Patient Instructions (Signed)
 It was great to see you!  Our plans for today:  - Do the hamstring stretches and exercises on the right side like we talked about.  - Make sure to stay hydrated. Continue the tylenol and robaxin  as you have been. - Come back in 4-6 weeks for the next assessment and treatment.   Take care and seek immediate care sooner if you develop any concerns.   Dr. Lavon Pound on your back. Bend your right knee so that your right foot is flat on the floor. Cross your left leg over your right so that your left ankle rests on your right knee. Use your hands to grab hold of your left knee and pull it gently toward the opposite shoulder. You should feel the stretch in your buttocks and hips. Hold for 15 to 30 seconds. Relax, and then repeat with the other leg. Repeat this cycle 2 to 4 times.   Lie on your back in a doorway, with one leg through the open door. Slide your leg up the wall to straighten your knee. You should feel a gentle stretch down the back of your leg. Hold the stretch for at least 1 minute. As the days go by, add a little more time until you can relax and let these muscles stretch for as much as 6 minutes for each leg. Do not arch your back. Do not bend either knee. Keep one heel touching the floor and the other heel touching the wall. Do not point your toes. Repeat with your other leg. Do 2 to 4 times for each leg. If you do not have a place to do this exercise in a doorway, there is another way to do it:  Lie on your back and bend the knee of the leg you want to stretch. Loop a towel under the ball and toes of that foot, and hold the ends of the towel in your hands. Straighten your knee and slowly pull back on the towel. You should feel a gentle stretch down the back of your leg. It is hard to hold this stretch with a towel for a long time, but hold the stretch for at least 15 to 30 seconds. One minute or more is even better. Repeat with your other leg. Do 2 to 4 times for each  leg.   Child's Pose Kneel on the floor with your toes together and your knees hip-width apart. Rest your palms on top of your thighs. On an exhale, lower your torso between your knees. Extend your arms alongside your torso with your palms facing down. Relax your shoulders toward the ground. Rest in the pose for as long as needed.

## 2024-04-29 NOTE — Progress Notes (Signed)
   SUBJECTIVE:   CHIEF COMPLAINT / HPI:   R sided low back pain with sciatica - previously seen last month with OMT treatment with good response.  - has had some worsened pain over the last few weeks with unknown trigger. - lumbar XR 03/2024 without acute findings, with preserved joint spaces. Incidentally sacralization of L5.  - works mainly desk job at [employer], pain is worse with sitting prolonged periods of time but does find time to get up and move around. - using tylenol, heating pad, robaxin  with some relief. Unable to use ibuprofen given some digestive difficulties, has EGD upcoming this week.  OBJECTIVE:   There were no vitals taken for this visit.  Gen: well appearing, in NAD Musculoskeletal:  Exam found Decreased ROM, Tissue texture changes, Tenderness to palpation, and Asymmetry of patient's  head, neck, thorax, lumbar, pelvis, and sacrum Osteopathic Structural Exam:   Cephalad R ear, shoulder, hip, knee.  Head: cranial strain   Neck: hypertonic cervical and trap muscles.   Thorax: T4-6 E SR RR. Hypertonic and tender R sided rhomboid  Lumbar: L3-4 E SR RR. Hypertonic R lumbar paravertebral muscles.  Pelvis: anterior R innominate  OMT procedure: After verbal consent was obtained, patient was treated today with osteopathic manipulative medicine to the regions of the head, neck, thorax, lumbar, and pelvis using the techniques of cranial, biodynamics, Still, FPR, myofascial release, muscle energy, and soft tissue. Areas of compensation relating to her primary pain source also treated. Patient tolerated the procedure well with good objective and good subjective improvement in symptoms. .sex left the room in good condition. He was advised to stay well hydrated and that he may have some soreness following the procedure. If not improving or worsening, he will call and come in. Home exercise program of stretches for gluts and lumbar discussed and demonstrated today. Patient will do these  stretches BID to before the point of pain, and will return for reevaluation  in 1-2 months.  ASSESSMENT/PLAN:   Low back pain Likely MSK etiology with some somatic dysfunction contributing. Treated today with osteopathic manipulative treatment with good results as above. F/u 1 month for retreatment as needed.    Kandis Ormond, DO

## 2024-04-29 NOTE — Assessment & Plan Note (Signed)
 Likely MSK etiology with some somatic dysfunction contributing. Treated today with osteopathic manipulative treatment with good results as above. F/u 1 month for retreatment as needed.

## 2024-05-01 ENCOUNTER — Encounter: Payer: Self-pay | Admitting: Gastroenterology

## 2024-05-01 ENCOUNTER — Ambulatory Visit (AMBULATORY_SURGERY_CENTER): Admitting: Gastroenterology

## 2024-05-01 VITALS — BP 137/80 | HR 72 | Temp 98.2°F | Resp 15 | Ht 65.0 in | Wt 196.0 lb

## 2024-05-01 DIAGNOSIS — D509 Iron deficiency anemia, unspecified: Secondary | ICD-10-CM | POA: Diagnosis not present

## 2024-05-01 DIAGNOSIS — K2950 Unspecified chronic gastritis without bleeding: Secondary | ICD-10-CM

## 2024-05-01 DIAGNOSIS — R634 Abnormal weight loss: Secondary | ICD-10-CM

## 2024-05-01 DIAGNOSIS — R1013 Epigastric pain: Secondary | ICD-10-CM

## 2024-05-01 DIAGNOSIS — R1319 Other dysphagia: Secondary | ICD-10-CM

## 2024-05-01 DIAGNOSIS — F419 Anxiety disorder, unspecified: Secondary | ICD-10-CM | POA: Diagnosis not present

## 2024-05-01 DIAGNOSIS — I10 Essential (primary) hypertension: Secondary | ICD-10-CM | POA: Diagnosis not present

## 2024-05-01 DIAGNOSIS — R1310 Dysphagia, unspecified: Secondary | ICD-10-CM | POA: Diagnosis not present

## 2024-05-01 MED ORDER — SODIUM CHLORIDE 0.9 % IV SOLN: 500.0000 mL | Freq: Once | INTRAVENOUS | Status: DC

## 2024-05-01 NOTE — Progress Notes (Signed)
 Updated medical record.

## 2024-05-01 NOTE — Progress Notes (Signed)
 Called to room to assist during endoscopic procedure.  Patient ID and intended procedure confirmed with present staff. Received instructions for my participation in the procedure from the performing physician.

## 2024-05-01 NOTE — Progress Notes (Signed)
 [facility] History and Physical   Primary Care Physician:  Lavada Porteous, DO   Reason for Procedure:   Epigastric pain, dyspepsia, dysphagia  Plan:    EGD     HPI: Alexandra Benjamin is a 48 y.o. female undergoing initial average risk screening colonoscopy.  He has no family history of colon cancer and no chronic GI symptoms.    Past Medical History:  Diagnosis Date   Anemia    Anxiety    Exposure to sexually transmitted disease (STD) 09/25/2019   GERD (gastroesophageal reflux disease)    Hypertension    Thyroid  disease    Graves disease    Past Surgical History:  Procedure Laterality Date   COLONOSCOPY  08/23/2023   eyelid surgery     FINGER FRACTURE SURGERY     TUBAL LIGATION      Prior to Admission medications   Medication Sig Start Date End Date Taking? Authorizing Provider  Ferrous Sulfate (IRON) 325 (65 Fe) MG TABS Take by mouth daily.   Yes [provider]  losartan -hydrochlorothiazide  (HYZAAR ) 100-25 MG tablet Take 1 tablet by mouth daily. 01/27/24  Yes Lavada Porteous, DO  methocarbamol  (ROBAXIN ) 500 MG tablet Take 1 tablet (500 mg total) by mouth every 8 (eight) hours as needed for muscle spasms. 03/26/24  Yes Lavada Porteous, DO  ONE DAILY MULTIPLE VITAMIN PO Take by mouth.   Yes [provider]  pantoprazole  (PROTONIX ) 40 MG tablet Take 1 tablet (40 mg total) by mouth daily. 12/25/23  Yes Lavada Porteous, DO    Current Outpatient Medications  Medication Sig Dispense Refill   Ferrous Sulfate (IRON) 325 (65 Fe) MG TABS Take by mouth daily.     losartan -hydrochlorothiazide  (HYZAAR ) 100-25 MG tablet Take 1 tablet by mouth daily. 60 tablet 1   methocarbamol  (ROBAXIN ) 500 MG tablet Take 1 tablet (500 mg total) by mouth every 8 (eight) hours as needed for muscle spasms. 30 tablet 1   ONE DAILY MULTIPLE VITAMIN PO Take by mouth.     pantoprazole  (PROTONIX ) 40 MG tablet Take 1 tablet (40 mg total) by mouth daily. 30 tablet 3   Current  Facility-Administered Medications  Medication Dose Route Frequency Provider Last Rate Last Admin   0.9 %  sodium chloride  infusion  500 mL Intravenous Once Elois Hair, MD        Allergies as of 05/01/2024 - Review Complete 05/01/2024  Allergen Reaction Noted   Other Hives 02/23/2021   Lexapro  [escitalopram ] Other (See Comments) 01/27/2024   Sprintec  28 [norgestimate -eth estradiol ] Hypertension 01/27/2024    Family History  Problem Relation Age of Onset   Heart disease Father    Stomach cancer Maternal Grandmother    Diabetes Maternal Grandmother    Diabetes Paternal Grandmother    Colon cancer Neg Hx    Esophageal cancer Neg Hx    Rectal cancer Neg Hx     Social History   Socioeconomic History   Marital status: Legally Separated    Spouse name: Not on file   Number of children: Not on file   Years of education: Not on file   Highest education level: Bachelor's degree (e.g., BA, AB, BS)  Occupational History   Not on file  Tobacco Use   Smoking status: Former    Types: Cigarettes   Smokeless tobacco: Never  Vaping Use   Vaping status: Never Used  Substance and Sexual Activity   Alcohol use: Yes    Alcohol/week: 1.0 - 2.0 standard drink of  alcohol    Types: 1 - 2 Glasses of wine per week   Drug use: No   Sexual activity: Yes    Birth control/protection: Surgical, Implant  Other Topics Concern   Not on file  Social History Narrative   Not on file   Social Drivers of Health   Financial Resource Strain: Low Risk  (07/18/2023)   Overall Financial Resource Strain (CARDIA)    Difficulty of Paying Living Expenses: Not hard at all  Food Insecurity: No Food Insecurity (07/18/2023)   Hunger Vital Sign    Worried About Running Out of Food in the Last Year: Never true    Ran Out of Food in the Last Year: Never true  Transportation Needs: No Transportation Needs (07/18/2023)   PRAPARE - Administrator, Civil Service (Medical): No    Lack of  Transportation (Non-Medical): No  Physical Activity: Insufficiently Active (07/18/2023)   Exercise Vital Sign    Days of Exercise per Week: 4 days    Minutes of Exercise per Session: 20 min  Stress: No Stress Concern Present (07/18/2023)   [employer] of Occupational Health - Occupational Stress Questionnaire    Feeling of Stress : Only a little  Social Connections: Socially Isolated (07/18/2023)   Social Connection and Isolation Panel    Frequency of Communication with Friends and Family: More than three times a week    Frequency of Social Gatherings with Friends and Family: Three times a week    Attends Religious Services: Never    Active Member of Clubs or Organizations: No    Attends Engineer, structural: Not on file    Marital Status: Separated  Intimate Partner Violence: Not on file    Review of Systems:  All other review of systems negative except as mentioned in the HPI.  Physical Exam: Vital signs BP (!) 136/103   Pulse 80   Temp 98.2 F (36.8 C) (Temporal)   Ht 5' 5 (1.651 m)   Wt 196 lb (88.9 kg)   SpO2 100%   BMI 32.62 kg/m   General:   Alert,  Well-developed, well-nourished, pleasant and cooperative in NAD Airway:  Mallampati 2 Lungs:  Clear throughout to auscultation.   Heart:  Regular rate and rhythm; no murmurs, clicks, rubs,  or gallops. Abdomen:  Soft, nontender and nondistended. Normal bowel sounds.   Neuro/Psych:  Normal mood and affect. A and O x 3   [place] Marina E. Cherryl Corona, MD [facility]

## 2024-05-01 NOTE — Progress Notes (Signed)
 Sedate, gd SR, tolerated procedure well, VSS, report to RN

## 2024-05-01 NOTE — Patient Instructions (Signed)

## 2024-05-01 NOTE — Op Note (Signed)
 [facility] Patient Name: Alexandra Benjamin Procedure Date: 05/01/2024 4:32 PM MRN: [id number] Endoscopist: Geralyn Knee E. Cherryl Corona , MD, [id number] Age: 48 Referring MD:  Date of Birth: 24-Oct-1976 Gender: Female Account #: [id number] Procedure:                Upper GI endoscopy Indications:              Epigastric abdominal pain, Dysphagia Medicines:                Monitored Anesthesia Care Procedure:                Pre-Anesthesia Assessment:                           - Prior to the procedure, a History and Physical                            was performed, and patient medications and                            allergies were reviewed. The patient's tolerance of                            previous anesthesia was also reviewed. The risks                            and benefits of the procedure and the sedation                            options and risks were discussed with the patient.                            All questions were answered, and informed consent                            was obtained. Prior Anticoagulants: The patient has                            taken no anticoagulant or antiplatelet agents. ASA                            Grade Assessment: II - A patient with mild systemic                            disease. After reviewing the risks and benefits,                            the patient was deemed in satisfactory condition to                            undergo the procedure.                           After obtaining informed consent, the endoscope was  passed under direct vision. Throughout the                            procedure, the patient's blood pressure, pulse, and                            oxygen saturations were monitored continuously. The                            GIF [id number] #[id number] was introduced through the                            mouth, and advanced to the second part of duodenum.                            The upper GI  endoscopy was accomplished without                            difficulty. The patient tolerated the procedure                            well. Scope In: Scope Out: Findings:                 The examined portions of the nasopharynx,                            oropharynx and larynx were normal.                           The examined esophagus was normal. Biopsies were                            obtained from the proximal and distal esophagus                            with cold forceps for histology of suspected                            eosinophilic esophagitis.                           Diffuse nodular mucosa was found in the gastric                            body. Biopsies were taken with a cold forceps for                            Helicobacter pylori testing. Estimated blood loss                            was minimal.                           The exam of the stomach was otherwise normal.  The examined duodenum was normal. Complications:            No immediate complications. Estimated Blood Loss:     Estimated blood loss was minimal. Impression:               - The examined portions of the nasopharynx,                            oropharynx and larynx were normal.                           - Normal esophagus.                           - Nodular mucosa in the gastric body. Biopsied.                           - Normal examined duodenum.                           - Biopsies were taken with a cold forceps for                            evaluation of eosinophilic esophagitis. Recommendation:           - Patient has a contact number available for                            emergencies. The signs and symptoms of potential                            delayed complications were discussed with the                            patient. Return to normal activities tomorrow.                            Written discharge instructions were provided to the                             patient.                           - Resume previous diet.                           - Continue present medications.                           - Await pathology results. Lacara Dunsworth E. Cherryl Corona, MD 05/01/2024 4:52:30 PM This report has been signed electronically.

## 2024-05-04 ENCOUNTER — Ambulatory Visit: Admitting: Family Medicine

## 2024-05-04 ENCOUNTER — Telehealth: Payer: Self-pay | Admitting: *Deleted

## 2024-05-04 NOTE — Telephone Encounter (Signed)
 Attempted post procedure follow up call.  No answer - LVM.

## 2024-05-06 LAB — SURGICAL PATHOLOGY

## 2024-05-10 ENCOUNTER — Ambulatory Visit: Payer: Self-pay | Admitting: Gastroenterology

## 2024-05-10 NOTE — Progress Notes (Signed)
 Alexandra Benjamin,  The biopsies taken from your stomach were notable for mild chronic gastritis (inflammation) which is a common finding, but there was no evidence of Helicobacter pylori infection. This common finding is not likely to explain abdominal pain and there is no specific treatment or further evaluation recommended. The biopsies of your esophagus were normal, with no evidence of eosinophilic esophagitis to explain your swallowing difficulties.  Please follow up with us  in the office as needed for further evaluation and management of chronic GI symptoms.

## 2024-05-12 ENCOUNTER — Encounter: Payer: Self-pay | Admitting: Student

## 2024-05-12 ENCOUNTER — Ambulatory Visit: Payer: Self-pay | Admitting: Student

## 2024-05-12 ENCOUNTER — Other Ambulatory Visit (HOSPITAL_COMMUNITY): Payer: Self-pay

## 2024-05-12 VITALS — BP 137/100 | HR 70 | Ht 65.0 in | Wt 205.1 lb

## 2024-05-12 DIAGNOSIS — F419 Anxiety disorder, unspecified: Secondary | ICD-10-CM

## 2024-05-12 DIAGNOSIS — I10 Essential (primary) hypertension: Secondary | ICD-10-CM

## 2024-05-12 DIAGNOSIS — M5431 Sciatica, right side: Secondary | ICD-10-CM | POA: Diagnosis not present

## 2024-05-12 MED ORDER — HYDROXYZINE HCL 25 MG PO TABS
25.0000 mg | ORAL_TABLET | Freq: Three times a day (TID) | ORAL | 0 refills | Status: DC | PRN
  Filled 2024-05-12: qty 30, 10d supply, fill #0

## 2024-05-12 MED ORDER — PREDNISONE 20 MG PO TABS
40.0000 mg | ORAL_TABLET | Freq: Every day | ORAL | 0 refills | Status: AC
  Filled 2024-05-12: qty 10, 5d supply, fill #0

## 2024-05-12 NOTE — Progress Notes (Signed)
    SUBJECTIVE:   CHIEF COMPLAINT / HPI:   Hypertension Discussed continued elevated blood pressures in our office.  Previous history making, patient would like to check his blood pressures at home.  Bleeding Received IUD for menorrhagia.  With IUD patient longer having blood clots, and thinks he may be bleeding less.  However her menstrual periods lasting longer than prior, sometimes up to 2 weeks.  Her period remains normal cycles.  Normal cramping, no other symptoms.  She had failed OCPs 2/2 elevated blood pressure.  Back pain Chronic low back pain.  Currently receiving OMT through our clinic.  Static symptoms have worsened.  She is also taking Robaxin .  Pain is worse in the morning after waking up, and during the day with activity.  No red flag symptoms.  Anxiety Poorly controlled anxiety, may be contributing to her poorly controlled high blood pressure.  She was previously taking Lexapro , which improved anxiety however she had significant weight loss.  The weight loss prompted a thorough GI workup, which at this time has been unremarkable.  She has been starting to gain weight after stopping Lexapro .  She recently had a panic attack a few days ago, and would like to restart treatment.  She is interested in learning what her options.  We discussed that we will address this in a separate appointment to give at the appropriate time if needed.   OBJECTIVE:   BP (!) 148/98   Pulse 70   Ht 5' 5 (1.651 m)   Wt 205 lb 2 oz (93 kg)   SpO2 100%   BMI 34.13 kg/m    General: NAD, well-appearing, well-nourished Respiratory: No respiratory distress, breathing comfortably, able to speak in full sentences Skin: warm and dry, no rashes noted on exposed skin Psych: Appropriate affect and mood  ASSESSMENT/PLAN:   Assessment & Plan Sciatica of right side Ongoing sciatic pain. - Continue OMT - Referral to physical therapy - Prednisone 40 mg daily for 5 days - Consider referral to  orthopedics if pain does not improve with physical therapy - Consider gabapentin HTN (hypertension), benign Using shared decision making, patient will check blood pressures at home.  If they remain elevated, she is agreeable to starting additional agents.  I do suspect anxiety may be contributing to her high blood pressure, and if we can control her anxiety and decrease to see if blood pressure improves. - Follow-up in 2-4 weeks with blood pressure log (advised not to start blood pressure log until after missing prednisone course) Anxiety Poorly controlled, feels Lexapro  2/2 side effect. Follow-up at earliest convenience to discuss further. - Hydroxyzine 25 mg 3 times daily as needed for panic attacks    Gladis Church, DO [facility]

## 2024-05-12 NOTE — Patient Instructions (Signed)
 It was great to see you! Thank you for allowing me to participate in your care!   I recommend that you always bring your medications to each appointment as this makes it easy to ensure we are on the correct medications and helps us  not miss when refills are needed.  Our plans for today:  - Please take 40 mg of prednisone once a day in the morning with breakfast, for 5 days -I have sent a referral for physical therapy -Please schedule appointment for 2-4 weeks for blood pressure, please take your blood pressure twice a day and write these down and bring to next appointment - Please schedule super appointment for anxiety. - You can take 25 mg of hydroxyzine 3 times daily as needed for panic attacks, we will discuss a maintenance medication at your next appointment   Take care and seek immediate care sooner if you develop any concerns. Please remember to show up 15 minutes before your scheduled appointment time!  Gladis Church, DO [facility]

## 2024-05-12 NOTE — Assessment & Plan Note (Signed)
 Using shared decision making, patient will check blood pressures at home.  If they remain elevated, she is agreeable to starting additional agents.  I do suspect anxiety may be contributing to her high blood pressure, and if we can control her anxiety and decrease to see if blood pressure improves. - Follow-up in 2-4 weeks with blood pressure log (advised not to start blood pressure log until after missing prednisone course)

## 2024-05-15 ENCOUNTER — Other Ambulatory Visit (HOSPITAL_COMMUNITY): Payer: Self-pay

## 2024-05-15 MED ORDER — BLOOD PRESSURE MONITOR 3 DEVI
0 refills | Status: AC
  Filled 2024-05-15: qty 1, 365d supply, fill #0

## 2024-06-01 ENCOUNTER — Ambulatory Visit: Admitting: Student

## 2024-06-02 ENCOUNTER — Other Ambulatory Visit: Payer: Self-pay | Admitting: Student

## 2024-06-02 ENCOUNTER — Other Ambulatory Visit: Payer: Self-pay

## 2024-06-02 ENCOUNTER — Encounter: Payer: Self-pay | Admitting: Physical Therapy

## 2024-06-02 ENCOUNTER — Ambulatory Visit: Admitting: Physical Therapy

## 2024-06-02 DIAGNOSIS — M5441 Lumbago with sciatica, right side: Secondary | ICD-10-CM | POA: Diagnosis not present

## 2024-06-02 DIAGNOSIS — R2689 Other abnormalities of gait and mobility: Secondary | ICD-10-CM | POA: Diagnosis not present

## 2024-06-02 DIAGNOSIS — M6281 Muscle weakness (generalized): Secondary | ICD-10-CM

## 2024-06-02 DIAGNOSIS — G8929 Other chronic pain: Secondary | ICD-10-CM | POA: Diagnosis not present
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
## 2024-06-03 ENCOUNTER — Other Ambulatory Visit (HOSPITAL_COMMUNITY): Payer: Self-pay

## 2024-06-03 MED ORDER — METHOCARBAMOL 500 MG PO TABS
500.0000 mg | ORAL_TABLET | Freq: Three times a day (TID) | ORAL | 1 refills | Status: DC | PRN
  Filled 2024-06-03 – 2024-06-15 (×2): qty 30, 10d supply, fill #0
  Filled 2024-07-14: qty 30, 10d supply, fill #1

## 2024-06-05 ENCOUNTER — Ambulatory Visit: Admitting: Student

## 2024-06-08 ENCOUNTER — Ambulatory Visit: Admitting: Family Medicine

## 2024-06-08 ENCOUNTER — Encounter: Payer: Self-pay | Admitting: Family Medicine

## 2024-06-08 DIAGNOSIS — M9901 Segmental and somatic dysfunction of cervical region: Secondary | ICD-10-CM | POA: Diagnosis not present

## 2024-06-08 DIAGNOSIS — M9902 Segmental and somatic dysfunction of thoracic region: Secondary | ICD-10-CM | POA: Diagnosis not present

## 2024-06-08 DIAGNOSIS — M9900 Segmental and somatic dysfunction of head region: Secondary | ICD-10-CM | POA: Diagnosis not present

## 2024-06-08 DIAGNOSIS — M9905 Segmental and somatic dysfunction of pelvic region: Secondary | ICD-10-CM

## 2024-06-08 DIAGNOSIS — M9904 Segmental and somatic dysfunction of sacral region: Secondary | ICD-10-CM | POA: Diagnosis not present

## 2024-06-08 DIAGNOSIS — M5441 Lumbago with sciatica, right side: Secondary | ICD-10-CM
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
## 2024-06-08 NOTE — Patient Instructions (Signed)
 It was great to see you!  Our plans for today:  - Start the exercises your physical therapist gave you. Keep doing physical therapy.  - You may be sore after today's treatment, make sure to stay hydrated.  - Wear supportive shoes at work.  - Come back in 4-6 weeks for your next treatment.  Take care and seek immediate care sooner if you develop any concerns.   Dr. Kayzen Kendzierski

## 2024-06-08 NOTE — Assessment & Plan Note (Signed)
 Likely MSK etiology with some somatic dysfunction contributing. Treated today with osteopathic manipulative treatment with good results as above. F/u 1 month for retreatment as needed.

## 2024-06-09 ENCOUNTER — Encounter: Admitting: Rehabilitative and Restorative Service Providers"

## 2024-06-11 ENCOUNTER — Other Ambulatory Visit (HOSPITAL_COMMUNITY): Payer: Self-pay

## 2024-06-11 ENCOUNTER — Ambulatory Visit: Admitting: Student

## 2024-06-12 ENCOUNTER — Other Ambulatory Visit: Payer: Self-pay | Admitting: Student

## 2024-06-15 ENCOUNTER — Other Ambulatory Visit (HOSPITAL_COMMUNITY): Payer: Self-pay

## 2024-06-16 ENCOUNTER — Other Ambulatory Visit (HOSPITAL_COMMUNITY): Payer: Self-pay

## 2024-06-16 MED ORDER — HYDROXYZINE HCL 25 MG PO TABS
25.0000 mg | ORAL_TABLET | Freq: Three times a day (TID) | ORAL | 0 refills | Status: AC | PRN
  Filled 2024-06-16: qty 30, 10d supply, fill #0

## 2024-06-17 ENCOUNTER — Ambulatory Visit: Admitting: Rehabilitative and Restorative Service Providers"

## 2024-06-17 ENCOUNTER — Encounter: Payer: Self-pay | Admitting: Rehabilitative and Restorative Service Providers"

## 2024-06-17 DIAGNOSIS — M6281 Muscle weakness (generalized): Secondary | ICD-10-CM | POA: Diagnosis not present

## 2024-06-17 DIAGNOSIS — M5441 Lumbago with sciatica, right side: Secondary | ICD-10-CM

## 2024-06-17 DIAGNOSIS — G8929 Other chronic pain: Secondary | ICD-10-CM

## 2024-06-17 DIAGNOSIS — R2689 Other abnormalities of gait and mobility: Secondary | ICD-10-CM | POA: Diagnosis not present
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
## 2024-06-22 ENCOUNTER — Encounter: Admitting: Physical Therapy

## 2024-07-02 ENCOUNTER — Encounter: Admitting: Rehabilitative and Restorative Service Providers"

## 2024-07-08 ENCOUNTER — Encounter: Payer: Self-pay | Admitting: Rehabilitative and Restorative Service Providers"

## 2024-07-08 ENCOUNTER — Ambulatory Visit (INDEPENDENT_AMBULATORY_CARE_PROVIDER_SITE_OTHER): Admitting: Rehabilitative and Restorative Service Providers"

## 2024-07-08 DIAGNOSIS — M5441 Lumbago with sciatica, right side: Secondary | ICD-10-CM

## 2024-07-08 DIAGNOSIS — M6281 Muscle weakness (generalized): Secondary | ICD-10-CM | POA: Diagnosis not present

## 2024-07-08 DIAGNOSIS — R2689 Other abnormalities of gait and mobility: Secondary | ICD-10-CM | POA: Diagnosis not present

## 2024-07-08 DIAGNOSIS — G8929 Other chronic pain: Secondary | ICD-10-CM
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
## 2024-07-14 ENCOUNTER — Other Ambulatory Visit (HOSPITAL_COMMUNITY): Payer: Self-pay

## 2024-07-14 ENCOUNTER — Other Ambulatory Visit: Payer: Self-pay

## 2024-07-14 ENCOUNTER — Other Ambulatory Visit: Payer: Self-pay | Admitting: Student

## 2024-07-14 MED ORDER — LOSARTAN POTASSIUM-HCTZ 100-25 MG PO TABS
1.0000 | ORAL_TABLET | Freq: Every day | ORAL | 1 refills | Status: DC
  Filled 2024-07-14: qty 60, 60d supply, fill #0
  Filled 2024-09-08: qty 60, 60d supply, fill #1

## 2024-07-16 ENCOUNTER — Encounter
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
## 2024-07-17 ENCOUNTER — Other Ambulatory Visit (HOSPITAL_COMMUNITY): Payer: Self-pay

## 2024-08-06 ENCOUNTER — Encounter: Payer: Self-pay | Admitting: Family Medicine

## 2024-08-06 ENCOUNTER — Ambulatory Visit (INDEPENDENT_AMBULATORY_CARE_PROVIDER_SITE_OTHER): Admitting: Family Medicine

## 2024-08-06 DIAGNOSIS — G8929 Other chronic pain: Secondary | ICD-10-CM

## 2024-08-06 DIAGNOSIS — M9905 Segmental and somatic dysfunction of pelvic region: Secondary | ICD-10-CM | POA: Diagnosis not present

## 2024-08-06 DIAGNOSIS — M9902 Segmental and somatic dysfunction of thoracic region: Secondary | ICD-10-CM | POA: Diagnosis not present

## 2024-08-06 DIAGNOSIS — M9901 Segmental and somatic dysfunction of cervical region: Secondary | ICD-10-CM

## 2024-08-06 DIAGNOSIS — M542 Cervicalgia: Secondary | ICD-10-CM

## 2024-08-06 DIAGNOSIS — M9900 Segmental and somatic dysfunction of head region: Secondary | ICD-10-CM

## 2024-08-06 DIAGNOSIS — M9908 Segmental and somatic dysfunction of rib cage: Secondary | ICD-10-CM

## 2024-08-06 DIAGNOSIS — M9904 Segmental and somatic dysfunction of sacral region: Secondary | ICD-10-CM

## 2024-08-06 DIAGNOSIS — M5441 Lumbago with sciatica, right side: Secondary | ICD-10-CM | POA: Diagnosis not present

## 2024-08-06 NOTE — Patient Instructions (Signed)
 It was great to see you!  Our plans for today:  - Continue your glut, low back hip exercises as we discussed.  - Try neck stretches as below. - We are ordering an MRI of your lower back spine. Let us  know if you don't hear about an appointment in the next few weeks.   Take care and seek immediate care sooner if you develop any concerns.   Dr. Genieve Ramaswamy
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
## 2024-08-06 NOTE — Assessment & Plan Note (Addendum)
 Likely MSK etiology with some somatic dysfunction contributing. Concern for worsening sciactic pain despite adequate efforts and therapy, will obtain MRI. Treated today with osteopathic manipulative treatment with good results as above. Home exercise program provided. F/u 1 month for retreatment as needed.

## 2024-08-11 ENCOUNTER — Ambulatory Visit
Admission: RE | Admit: 2024-08-11 | Discharge: 2024-08-11 | Disposition: A | Discharge status: Home / Self Care | Source: Ambulatory Visit | Attending: Family Medicine

## 2024-08-11 DIAGNOSIS — M47817 Spondylosis without myelopathy or radiculopathy, lumbosacral region: Secondary | ICD-10-CM | POA: Diagnosis not present

## 2024-08-11 DIAGNOSIS — G8929 Other chronic pain: Secondary | ICD-10-CM

## 2024-08-13 ENCOUNTER — Ambulatory Visit: Payer: Self-pay | Admitting: Family Medicine

## 2024-08-13 DIAGNOSIS — G8929 Other chronic pain: Secondary | ICD-10-CM

## 2024-08-21 ENCOUNTER — Ambulatory Visit: Admitting: Student

## 2024-08-25 ENCOUNTER — Ambulatory Visit: Admitting: Student

## 2024-09-07 ENCOUNTER — Ambulatory Visit: Admitting: Family Medicine

## 2024-09-08 ENCOUNTER — Other Ambulatory Visit: Payer: Self-pay | Admitting: Student

## 2024-09-08 ENCOUNTER — Other Ambulatory Visit: Payer: Self-pay

## 2024-09-08 DIAGNOSIS — M5441 Lumbago with sciatica, right side: Secondary | ICD-10-CM

## 2024-09-08 DIAGNOSIS — R1013 Epigastric pain: Secondary | ICD-10-CM

## 2024-09-09 ENCOUNTER — Other Ambulatory Visit (HOSPITAL_COMMUNITY): Payer: Self-pay

## 2024-09-09 MED ORDER — PANTOPRAZOLE SODIUM 40 MG PO TBEC
40.0000 mg | DELAYED_RELEASE_TABLET | Freq: Every day | ORAL | 3 refills | Status: AC
  Filled 2024-09-09: qty 30, 30d supply, fill #0
  Filled 2024-10-09: qty 30, 30d supply, fill #1
  Filled 2024-11-16: qty 30, 30d supply, fill #2

## 2024-09-09 MED ORDER — METHOCARBAMOL 500 MG PO TABS
500.0000 mg | ORAL_TABLET | Freq: Three times a day (TID) | ORAL | 1 refills | Status: AC | PRN
  Filled 2024-09-09: qty 30, 10d supply, fill #0
  Filled 2024-09-29: qty 30, 10d supply, fill #1

## 2024-09-10 ENCOUNTER — Encounter: Payer: Self-pay | Admitting: Physical Medicine & Rehabilitation

## 2024-09-10 ENCOUNTER — Ambulatory Visit: Admitting: Student

## 2024-09-10 ENCOUNTER — Other Ambulatory Visit (HOSPITAL_COMMUNITY): Payer: Self-pay

## 2024-09-11 ENCOUNTER — Other Ambulatory Visit (HOSPITAL_COMMUNITY)
Admission: RE | Admit: 2024-09-11 | Discharge: 2024-09-11 | Disposition: A | Discharge status: Home / Self Care | Source: Ambulatory Visit | Attending: Family Medicine | Admitting: Family Medicine

## 2024-09-11 ENCOUNTER — Ambulatory Visit (INDEPENDENT_AMBULATORY_CARE_PROVIDER_SITE_OTHER): Admitting: Student

## 2024-09-11 ENCOUNTER — Ambulatory Visit: Payer: Self-pay | Admitting: Student

## 2024-09-11 ENCOUNTER — Other Ambulatory Visit (HOSPITAL_COMMUNITY): Payer: Self-pay

## 2024-09-11 ENCOUNTER — Encounter: Payer: Self-pay | Admitting: Student

## 2024-09-11 VITALS — BP 138/84 | HR 68 | Temp 97.9°F | Ht 65.0 in | Wt 225.1 lb

## 2024-09-11 DIAGNOSIS — N898 Other specified noninflammatory disorders of vagina: Secondary | ICD-10-CM | POA: Insufficient documentation

## 2024-09-11 DIAGNOSIS — Z1231 Encounter for screening mammogram for malignant neoplasm of breast: Secondary | ICD-10-CM | POA: Diagnosis not present

## 2024-09-11 DIAGNOSIS — T8332XA Displacement of intrauterine contraceptive device, initial encounter: Secondary | ICD-10-CM

## 2024-09-11 DIAGNOSIS — R1084 Generalized abdominal pain: Secondary | ICD-10-CM | POA: Diagnosis not present

## 2024-09-11 DIAGNOSIS — N730 Acute parametritis and pelvic cellulitis: Secondary | ICD-10-CM | POA: Diagnosis not present

## 2024-09-11 DIAGNOSIS — N921 Excessive and frequent menstruation with irregular cycle: Secondary | ICD-10-CM

## 2024-09-11 LAB — POCT WET PREP (WET MOUNT)
Clue Cells Wet Prep Whiff POC: NEGATIVE
Trichomonas Wet Prep HPF POC: ABSENT

## 2024-09-11 MED ORDER — METRONIDAZOLE 500 MG PO TABS
500.0000 mg | ORAL_TABLET | Freq: Two times a day (BID) | ORAL | 0 refills | Status: DC
  Filled 2024-09-11: qty 28, 14d supply, fill #0

## 2024-09-11 MED ORDER — CEFTRIAXONE SODIUM 1 G IJ SOLR
0.5000 g | Freq: Once | INTRAMUSCULAR | Status: AC
  Administered 2024-09-11: 0.5 g via INTRAMUSCULAR

## 2024-09-11 MED ORDER — DOXYCYCLINE HYCLATE 100 MG PO TABS
100.0000 mg | ORAL_TABLET | Freq: Two times a day (BID) | ORAL | 0 refills | Status: DC
  Filled 2024-09-11: qty 28, 14d supply, fill #0

## 2024-09-11 NOTE — Progress Notes (Signed)
    SUBJECTIVE:   CHIEF COMPLAINT / HPI:   Discussed the use of AI scribe software for clinical note transcription with the patient, who gave verbal consent to proceed.  History of Present Illness Alexandra Benjamin is a 48 year old female who presents with right-sided abdominal pain and irregular menstrual cycles.  Right-sided abdominal pain - Onset Monday, lasting over a day - Localized to right lower abdomen - Described as contraction-like - Radiates to the back - Pain subsided but persistent overall body pain remains, described as crippling - Associated symptoms during episode: nausea, vomiting, decreased appetite, constipation - No fever, diarrhea, or blood in stool  Menstrual irregularities and abnormal uterine bleeding - Intrauterine device (IUD) placed on April/12/2023 - Initially periods were lighter and regular after IUD insertion, up until the last 2 months.  With multiple episodes of bleeding for months. - Last menstrual period lasted almost two weeks - Increased frequency and heaviness of periods with clotting - Cycle began a few days before onset of abdominal pain - Sexually active - Increased vaginal discharge - History chlamydia infection with a previous partner   OBJECTIVE:   BP 138/84   Pulse 68   Temp 97.9 F (36.6 C) (Oral)   Ht 5' 5 (1.651 m)   Wt 225 lb 2 oz (102.1 kg)   SpO2 100%   BMI 37.46 kg/m    General: NAD, pleasant  Cardio: RRR, no MRG Respiratory: CTAB, normal wob on RA Abdomen: Soft, nondistended.  Mild tenderness RLQ, suprapubic, LLQ Skin: Warm and dry  Chaperone: Tashira CMA Physical Exam GENITOURINARY: Cervix with 4 raised white papules (do not appear vesicular), IUD strings not visible, tender cervix on palpation, discharge and bleeding.  ASSESSMENT/PLAN:   Assessment & Plan Vaginal discharge PID (acute pelvic inflammatory disease) Generalized abdominal pain Multiple concerning findings on exam: Cervical motion tenderness,  cervical lesions with cervical discharge and absence of IUD strings.  Will treat empirically for PID given exam findings, reassuringly patient is well-appearing and afebrile.  Abdominal exam benign with nonlocalizing findings, and symptoms less concerning for severe intra-abdominal pathology including: Cholecystitis, appendicitis, pancreatitis, SBO. - Aptima swab - HIV/RPR (to be obtained by [facility]) -500 mg IM ceftriaxone today in office - Doxycycline  100 mg twice daily, metronidazole  500 mg twice daily for 14 days total course of antibiotics for empiric PID treatment - Transvaginal send as below - Referral to our colposcopy clinic - Follow-up next week - Return and ED precautions discussed Intrauterine contraceptive device threads lost, initial encounter -Obtain transvaginal ultrasound for confirmation of placement Screening mammogram for breast cancer -Mammogram ordered   Gladis Church, DO [name]

## 2024-09-11 NOTE — Patient Instructions (Addendum)
 It was great to see you! Thank you for allowing me to participate in your care!   I recommend that you always bring your medications to each appointment as this makes it easy to ensure we are on the correct medications and helps us  not miss when refills are needed.  Our plans for today:  - Please take doxycycline  twice daily and metronidazole  twice daily for 14 days -Please go to your scheduled transvaginal ultrasound to evaluate IUD strings and pelvic pain - We are checking some labs today, I will call you if they are abnormal will send you a MyChart message or a letter if they are normal.  If you do not hear about your labs in the next 2 weeks please let us  know.  Take care and seek immediate care sooner if you develop any concerns. Please remember to show up 15 minutes before your scheduled appointment time!  Gladis Church, DO [facility]   I recommend you undergo a mammogram.   You can call to schedule an appointment by calling [phone].   Directions [address] [place], [place] [address]  Please let me know if you have questions. I will send you a letter or call you with results.

## 2024-09-14 LAB — CERVICOVAGINAL ANCILLARY ONLY
Bacterial Vaginitis (gardnerella): NEGATIVE
Candida Glabrata: NEGATIVE
Candida Vaginitis: NEGATIVE
Chlamydia: NEGATIVE
Comment: NEGATIVE
Comment: NEGATIVE
Comment: NEGATIVE
Comment: NEGATIVE
Comment: NEGATIVE
Comment: NORMAL
Neisseria Gonorrhea: NEGATIVE
Trichomonas: NEGATIVE

## 2024-09-16 ENCOUNTER — Ambulatory Visit
Admission: RE | Admit: 2024-09-16 | Discharge: 2024-09-16 | Disposition: A | Source: Ambulatory Visit | Attending: Family Medicine | Admitting: Family Medicine

## 2024-09-16 DIAGNOSIS — N852 Hypertrophy of uterus: Secondary | ICD-10-CM | POA: Diagnosis not present

## 2024-09-16 DIAGNOSIS — T8332XA Displacement of intrauterine contraceptive device, initial encounter: Secondary | ICD-10-CM

## 2024-09-16 DIAGNOSIS — N898 Other specified noninflammatory disorders of vagina: Secondary | ICD-10-CM

## 2024-09-16 DIAGNOSIS — N730 Acute parametritis and pelvic cellulitis: Secondary | ICD-10-CM

## 2024-09-16 NOTE — Telephone Encounter (Signed)
 Called patient. Confirmed identity. Discussed negative STI testing, still recommend treatment based on her symptoms. Independently reviewed pelvic U/S, c/f IUD not in place, pending official radiology read particularly with what may be clot burden inside uterus. We did not obtain UPT at last visit, instructed patient to obtain home pregnancy test- she will call us  back if positive. GYN referral pending U/S read.

## 2024-09-17 NOTE — Addendum Note (Signed)
 Addended by: HOWELL LUNGER on: 09/17/2024 12:08 PM   Modules accepted: Orders

## 2024-09-18 ENCOUNTER — Ambulatory Visit (INDEPENDENT_AMBULATORY_CARE_PROVIDER_SITE_OTHER): Admitting: Student

## 2024-09-18 ENCOUNTER — Other Ambulatory Visit (HOSPITAL_COMMUNITY): Payer: Self-pay

## 2024-09-18 ENCOUNTER — Encounter: Payer: Self-pay | Admitting: Student

## 2024-09-18 VITALS — BP 130/98 | HR 91 | Ht 65.0 in | Wt 232.0 lb

## 2024-09-18 DIAGNOSIS — N889 Noninflammatory disorder of cervix uteri, unspecified: Secondary | ICD-10-CM | POA: Diagnosis not present

## 2024-09-18 DIAGNOSIS — N921 Excessive and frequent menstruation with irregular cycle: Secondary | ICD-10-CM | POA: Diagnosis not present

## 2024-09-18 DIAGNOSIS — M5431 Sciatica, right side: Secondary | ICD-10-CM | POA: Diagnosis not present

## 2024-09-18 DIAGNOSIS — T8332XA Displacement of intrauterine contraceptive device, initial encounter: Secondary | ICD-10-CM

## 2024-09-18 MED ORDER — MEDROXYPROGESTERONE ACETATE 10 MG PO TABS
10.0000 mg | ORAL_TABLET | Freq: Every day | ORAL | 0 refills | Status: DC
  Filled 2024-09-18: qty 10, 10d supply, fill #0

## 2024-09-18 NOTE — Patient Instructions (Addendum)
 It was great to see you! Thank you for allowing me to participate in your care!   I recommend that you always bring your medications to each appointment as this makes it easy to ensure we are on the correct medications and helps us  not miss when refills are needed.  Our plans for today:  - Please take 1 pill of Provera daily for 10 days to reduce bleeding - You will receive a call from gynecology to schedule appointment - Please follow-up with Dr. Rumball for OMT treatment, your back pain is very likely sciatica - You can continue to take Robaxin  as needed - Please keep your appointment with physical therapy next month  Take care and seek immediate care sooner if you develop any concerns. Please remember to show up 15 minutes before your scheduled appointment time!  Alexandra Church, DO [facility]

## 2024-09-18 NOTE — Progress Notes (Signed)
    SUBJECTIVE:   CHIEF COMPLAINT / HPI:   Discussed the use of AI scribe software for clinical note transcription with the patient, who gave verbal consent to proceed.  History of Present Illness Alexandra Benjamin is a 48 year old female who presents with abnormal uterine bleeding and pelvic pain.  Abnormal uterine bleeding - Irregular bleeding resumed yesterday after a period of cessation - This is the third episode of abnormal uterine bleeding - Recent ultrasound identified a large uterine fibroid/adenomyosis - Fibroid may be affecting the positioning of her intrauterine device (IUD) - Pregnancy test is negative - Currently taking antibiotics as a precaution against infection due to adnexal/cervical motion tenderness on exam last week.  Back pain with radiculopathy - Constant back pain - Pain worsened by driving, prolonged standing, and household chores - Radiation of pain down her leg to her foot - Physical therapy appointment scheduled for November - Uses pain relief as needed   OBJECTIVE:   BP (!) 130/98   Pulse 91   Ht 5' 5 (1.651 m)   Wt 232 lb (105.2 kg)   SpO2 98%   BMI 38.61 kg/m    General: NAD, pleasant Cardio: RRR, no MRG.  Respiratory: CTAB, normal wob on RA GI: Abdomen is soft, not tender, not distended. BS present MSK: Symptoms reproduced with deep palpation of piriformis muscle, suspicious for sciatica Skin: Warm and dry  ASSESSMENT/PLAN:   Assessment & Plan Menorrhagia with irregular cycle Intrauterine contraceptive device threads lost, initial encounter Lesion of cervix Abdominal pain improved.  Home pregnancy negative.  Ultrasound with fibroid versus adenomyosis, malposition versus expulsion of IUD.  Ongoing irregular menstrual bleeding.  Of note IUD was originally placed for control of irregular menstrual cycle with heavy bleeding, as COC's caused significant hypertension. - Provera 10 mg daily for 10 days - Complete PID treatment given exam  last week - Referral to gynecology for ongoing irregular/heavy menstrual bleeding, malpositioned IUD, and lesions of cervix (noted during visit on 09/07/2024) Sciatica of right side Provided reassurance that sciatica is not related to her malpositioned IUD. - Continue OMT - Follow-up with physical therapy next month - Consider gabapentin if no improvement with treatment above    Alexandra Church, DO [facility]

## 2024-09-24 ENCOUNTER — Other Ambulatory Visit: Payer: Self-pay | Admitting: Student

## 2024-09-24 DIAGNOSIS — N921 Excessive and frequent menstruation with irregular cycle: Secondary | ICD-10-CM

## 2024-09-24 MED ORDER — MEDROXYPROGESTERONE ACETATE 10 MG PO TABS
10.0000 mg | ORAL_TABLET | Freq: Every day | ORAL | 0 refills | Status: AC
  Filled 2024-09-24 – 2024-09-28 (×2): qty 10, 10d supply, fill #0

## 2024-09-25 ENCOUNTER — Other Ambulatory Visit (HOSPITAL_COMMUNITY): Payer: Self-pay

## 2024-09-28 ENCOUNTER — Other Ambulatory Visit (HOSPITAL_COMMUNITY): Payer: Self-pay

## 2024-09-29 ENCOUNTER — Ambulatory Visit: Admitting: Family Medicine

## 2024-10-08 ENCOUNTER — Other Ambulatory Visit: Payer: Self-pay | Admitting: Student

## 2024-10-08 DIAGNOSIS — N921 Excessive and frequent menstruation with irregular cycle: Secondary | ICD-10-CM

## 2024-10-09 ENCOUNTER — Encounter (HOSPITAL_COMMUNITY): Payer: Self-pay

## 2024-10-09 ENCOUNTER — Other Ambulatory Visit: Payer: Self-pay | Admitting: Student

## 2024-10-09 ENCOUNTER — Other Ambulatory Visit: Payer: Self-pay

## 2024-10-09 ENCOUNTER — Other Ambulatory Visit (HOSPITAL_COMMUNITY): Payer: Self-pay

## 2024-10-09 DIAGNOSIS — N921 Excessive and frequent menstruation with irregular cycle: Secondary | ICD-10-CM

## 2024-10-12 ENCOUNTER — Encounter: Payer: Self-pay | Admitting: Physical Medicine & Rehabilitation

## 2024-10-12 ENCOUNTER — Other Ambulatory Visit (HOSPITAL_COMMUNITY): Payer: Self-pay

## 2024-10-12 ENCOUNTER — Encounter: Attending: Physical Medicine & Rehabilitation | Admitting: Physical Medicine & Rehabilitation

## 2024-10-12 VITALS — BP 142/89 | HR 74 | Ht 65.0 in | Wt 228.2 lb

## 2024-10-12 DIAGNOSIS — M545 Low back pain, unspecified: Secondary | ICD-10-CM | POA: Insufficient documentation

## 2024-10-12 DIAGNOSIS — G8929 Other chronic pain: Secondary | ICD-10-CM | POA: Insufficient documentation

## 2024-10-12 DIAGNOSIS — M79671 Pain in right foot: Secondary | ICD-10-CM | POA: Diagnosis not present

## 2024-10-12 MED ORDER — GABAPENTIN 100 MG PO CAPS
100.0000 mg | ORAL_CAPSULE | Freq: Three times a day (TID) | ORAL | 3 refills | Status: AC
  Filled 2024-10-12: qty 180, 30d supply, fill #0
  Filled 2024-11-16: qty 180, 30d supply, fill #1

## 2024-10-12 NOTE — Progress Notes (Signed)
 Subjective:    Patient ID: Alexandra Benjamin, female    DOB: 1975-12-24, 48 y.o.   MRN: [id number]  HPI  Discussed the use of AI scribe software for clinical note transcription with the patient, who gave verbal consent to proceed.    Alexandra Benjamin is a 48 y.o. year old female  who  has a past medical history of Anemia, Anxiety, Exposure to sexually transmitted disease (STD) (09/25/2019), GERD (gastroesophageal reflux disease), Hypertension, and Thyroid  disease.   They are presenting to PM&R clinic as a new patient for pain management evaluation. They were referred by Dr. Rumball for treatment of R lower back pain.   Reports chronic right-sided low back pain for approximately one year, with radiation down the right leg to the heel. Pain began after a fall down stairs at the beginning of the year. Pain location varies. Standing for long periods (e.g., cooking, brushing teeth) exacerbates back pain. Pain also shoots down the leg into the foot, particularly in the morning, throughout the workday (involving sitting and standing), and sometimes at night, disturbing sleep. Pain previously radiated to the toes, but now localizes to the heel. Right calf and posterior knee pain also reported intermittently. Aggravating factors: Prolonged standing, driving for long periods. Relieving factors: Heating pad, pillow between legs when sleeping.  Treatments Tried: Medications:     - Over-the-counter: Tylenol, Advil (provides temporary relief, taken almost daily).     - Prescription: Muscle relaxer (taken once daily, prescribed for every 8 hours).     - Prednisone  course from provided complete but temporary relief. Non-pharmacologic:     - OMT with Dr. Romball every 4-6 weeks provides 2-3 days of relief.     - Physical therapy several months ago provided temporary help. Continues with take-home exercises and yoga stretching.     - TENS unit (borrowed from a friend) was very effective.     - Biofreeze cream  (some help).     - Massage gun (painful but helpful). No history of back injections or surgery. Discussed dry needling with a physical therapist but did not proceed. Denies trying Gabapentin , Lyrica, amitriptyline, nortriptyline, Cymbalta, venlafaxine, hydrocodone, oxycodone, or tramadol.    Medications tried: Topical medications-  biofreeze- didn't help  Nsaids  Advil- helpful Tylenol -helpful Opiates  - denies  Gabapentin  / Lyrica  - Denies  TCAs - Denies  SNRIs - Denies  Prednisone - helpful, had no pain Robaxin - helps a little   Other treatments: PT- Few months ago- temporary benefit  Yoga- hepful OMT treatment- helpful TENs unit- Helpful  Injections - denies  Surgery- denies  Heating pad helpful    Prior UDS results: No results found for: LABOPIA, COCAINSCRNUR, LABBENZ, AMPHETMU, THCU, LABBARB     Pain Inventory Average Pain 6 Pain Right Now 8 My pain is dull, stabbing, tingling, and aching  In the last 24 hours, has pain interfered with the following? General activity 5 Relation with others 5 Enjoyment of life 5 What TIME of day is your pain at its worst? morning  and varies Sleep (in general) Fair  Pain is worse with: inactivity and some activites Pain improves with: heat/ice, therapy/exercise, and medication Relief from Meds: 7  walk without assistance how many minutes can you walk? unlimited ability to climb steps?  yes do you drive?  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  no  Any changes since last visit?  no    Family History  Problem Relation Age of Onset   Heart disease Father    Stomach cancer Maternal Grandmother    Diabetes Maternal Grandmother    Diabetes Paternal Grandmother    Colon cancer Neg Hx    Esophageal cancer Neg Hx    Rectal cancer Neg Hx    Social  History   Socioeconomic History   Marital status: Legally Separated    Spouse name: Not on file   Number of children: Not on file   Years of education: Not on file   Highest education level: Bachelor's degree (e.g., BA, AB, BS)  Occupational History   Not on file  Tobacco Use   Smoking status: Former    Types: Cigarettes   Smokeless tobacco: Never  Vaping Use   Vaping status: Never Used  Substance and Sexual Activity   Alcohol use: Yes    Alcohol/week: 1.0 - 2.0 standard drink of alcohol    Types: 1 - 2 Glasses of wine per week   Drug use: No   Sexual activity: Yes    Birth control/protection: Surgical, Implant  Other Topics Concern   Not on file  Social History Narrative   Not on file   Social Drivers of Health   Financial Resource Strain: Low Risk  (07/18/2023)   Overall Financial Resource Strain (CARDIA)    Difficulty of Paying Living Expenses: Not hard at all  Food Insecurity: No Food Insecurity (07/18/2023)   Hunger Vital Sign    Worried About Running Out of Food in the Last Year: Never true    Ran Out of Food in the Last Year: Never true  Transportation Needs: No Transportation Needs (07/18/2023)   PRAPARE - Administrator, Civil Service (Medical): No    Lack of Transportation (Non-Medical): No  Physical Activity: Insufficiently Active (07/18/2023)   Exercise Vital Sign    Days of Exercise per Week: 4 days    Minutes of Exercise per Session: 20 min  Stress: No Stress Concern Present (07/18/2023)   [employer] of Occupational Health - Occupational Stress Questionnaire    Feeling of Stress : Only a little  Social Connections: Socially Isolated (07/18/2023)   Social Connection and Isolation Panel    Frequency of Communication with Friends and Family: More than three times a week    Frequency of Social Gatherings with Friends and Family: Three times a week    Attends Religious Services: Never    Active Member of Clubs or Organizations: No    Attends  Banker Meetings: Not on file    Marital Status: Separated   Past Surgical History:  Procedure Laterality Date   COLONOSCOPY  08/23/2023   eyelid surgery     FINGER FRACTURE SURGERY     TUBAL LIGATION     Past Medical History:  Diagnosis Date   Anemia    Anxiety    Exposure to sexually transmitted disease (STD) 09/25/2019   GERD (gastroesophageal reflux disease)    Hypertension    Thyroid  disease    Graves disease   BP (!) 142/89   Pulse 74   Ht 5' 5 (1.651 m)   Wt 228 lb 3.2 oz (103.5 kg)   SpO2 96%   BMI 37.97 kg/m   Opioid Risk Score:   Fall Risk Score:  `1  Depression screen [facility] 2/9     10/12/2024  3:08 PM 09/18/2024    2:34 PM 09/11/2024    8:51 AM 03/26/2024    3:53 PM 02/26/2024   10:35 AM 02/19/2024   10:40 AM 01/27/2024    4:23 PM  Depression screen PHQ 2/9  Decreased Interest 0 0 0 0 0 0 0  Down, Depressed, Hopeless 0 0 0 0 0 0 0  PHQ - 2 Score 0 0 0 0 0 0 0  Altered sleeping 2 2 2 2 1 1 1   Tired, decreased energy 2 2 2  0  1 1  Change in appetite 0 0 0 1 1 1 1   Feeling bad or failure about yourself  0 0 0 0 0 0 0  Trouble concentrating 0 0 0 0 0 0 1  Moving slowly or fidgety/restless 0 0 0 0 0 0 0  Suicidal thoughts 0 0 0 0 0 0 0  PHQ-9 Score 4 4  4  3  2  3  4    Difficult doing work/chores Somewhat difficult           Data saved with a previous flowsheet row definition     Review of Systems  Gastrointestinal:  Positive for constipation.  Endocrine:       High blood sugar  Musculoskeletal:  Positive for back pain.  Neurological:  Positive for weakness.       Tingling  All other systems reviewed and are negative.      Objective:   Physical Exam   Gen: no distress, normal appearing HEENT: oral mucosa pink and moist, NCAT Chest: normal effort, normal rate of breathing Abd: soft, non-distended Ext: no edema Psych: pleasant, normal affect Skin: intact Neuro: Alert and awake, follows commands, cranial nerves II through XII  grossly intact, normal speech and language RUE: 5/5 Deltoid, 5/5 Biceps, 5/5 Triceps, 5/5 Wrist Ext, 5/5 Grip LUE: 5/5 Deltoid, 5/5 Biceps, 5/5 Triceps, 5/5 Wrist Ext, 5/5 Grip RLE: HF 5/5, KE 5/5, ADF 5/5, APF 5/5 LLE: HF 5/5, KE 5/5, ADF 5/5, APF 5/5  Reports numbness in the right heel. Sensation is intact in the right toes and dorsal foot today. No numbness reported in the upper legs.  No limb ataxia or cerebellar signs. No abnormal tone appreciated.   Musculoskeletal:   Back: Tenderness over the right lower lumbar region. Pain with forward flexion and extension of the lumbar spine, with extension being worse. No increased pain on return from flexion.   Tenderness at B/L SI joints also noted.  Palpation of the upper back is not tender  Gait: Normal.  Straight Leg Raise: Right SLR elicits back pain but no radicular pain down the leg.  FABER and FADIR negative     MRI L spine 08/11/24 There is normal alignment of the lumbar spine. There is no vertebral body height loss, subluxation or marrow replacing process. The sacrum and SI joints are unremarkable so far as visualized. Conus and cauda equina are unremarkable.   T12-L1: There is no focal disc protrusion, foraminal or spinal stenosis. Mild facet arthrosis.   L1-2: There is no focal disc protrusion, foraminal or spinal stenosis. Mild facet arthrosis.   L2-3: There is no focal disc protrusion, foraminal or spinal stenosis.   L3-4: There is no focal disc protrusion, foraminal or spinal stenosis. Mild facet arthrosis.   L4-5: There is no focal disc protrusion, foraminal or spinal stenosis. Mild-to-moderate facet arthrosis.   L5-S1: There is no focal disc protrusion, foraminal or spinal stenosis. Moderate facet arthrosis.  The retroperitoneal structures demonstrate no significant abnormality. Partially imaged slightly prominent uterus is incompletely evaluated.   IMPRESSION:   Minimal disc desiccation and  mild-to-moderate facet arthrosis. No acute abnormality. No focal protrusion, foraminal or spinal stenosis.   Partially imaged slightly prominent uterus is incompletely evaluated.     Assessment & Plan:    Chronic right-sided low back pain: For approximately one year, with features suggestive of right-sided radiculopathy.  MRI findings are notable for mild to moderate lumbar facet arthropathy, most pronounced at the lower levels, which is a likely contributor to her axial back pain.   R Foot Pain The etiology of her radicular symptoms, including right heel numbness, is less clear, as the MRI did not show significant neural foraminal stenosis or nerve root impingement.  History of acid reflux  PLAN: -Will start Gabapentin  for neuropathic pain. Prescribed 100 mg capsules. Instructed to start with one capsule at night and titrate up as tolerated to a maximum of two capsules three times daily for effect.  -TENS Unit: Will send a prescription for a Zynex Nexwave. Discussed over-the-counter options are available.  -Anti-inflammatory Diet: Provided a handout on anti-inflammatory foods. The patient reports already incorporating some items like tart cherry juice and blueberries into her diet. -Continue Home Exercises: Encouraged to continue with her current home exercise program, including yoga and stretching. -Medial Branch Blocks: Discussed right-sided lumbar medial branch blocks as a diagnostic and potentially therapeutic option for the facet-mediated back pain. The patient will consider this option. -EMG/Nerve Conduction Study: Discussed as a potential future diagnostic test to further evaluate the etiology of her right leg and foot symptoms, including the heel numbness.   Plan to follow up in 6 weeks to reassess symptoms and treatment efficacy.

## 2024-10-21 ENCOUNTER — Ambulatory Visit
Admission: RE | Admit: 2024-10-21 | Discharge: 2024-10-21 | Disposition: A | Source: Ambulatory Visit | Attending: Family Medicine | Admitting: Family Medicine

## 2024-10-21 DIAGNOSIS — Z1231 Encounter for screening mammogram for malignant neoplasm of breast: Secondary | ICD-10-CM

## 2024-10-30 ENCOUNTER — Encounter: Payer: Self-pay | Admitting: Family Medicine

## 2024-10-30 ENCOUNTER — Other Ambulatory Visit (HOSPITAL_COMMUNITY): Payer: Self-pay

## 2024-10-30 ENCOUNTER — Ambulatory Visit: Admitting: Family Medicine

## 2024-10-30 DIAGNOSIS — G8929 Other chronic pain: Secondary | ICD-10-CM

## 2024-10-30 DIAGNOSIS — M5441 Lumbago with sciatica, right side: Secondary | ICD-10-CM | POA: Diagnosis not present

## 2024-10-30 MED ORDER — DULOXETINE HCL 30 MG PO CPEP
30.0000 mg | ORAL_CAPSULE | Freq: Every day | ORAL | 0 refills | Status: AC
  Filled 2024-10-30: qty 90, 90d supply, fill #0
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
## 2024-10-30 NOTE — Patient Instructions (Signed)
 It was great to see you!  Our plans for today:  - Continue doing yoga. - Start duloxetine 30mg  daily. This take a few weeks to reach therapeutic effect. Make an appointment in about a month to check in to see how you are doing.  - Come back in about 4-6 weeks for repeat treatment.  Take care and seek immediate care sooner if you develop any concerns.   Dr. Zenola Dezarn

## 2024-10-30 NOTE — Assessment & Plan Note (Addendum)
 Likely MSK etiology with some somatic dysfunction contributing. Independently reviewed MRI, facet arthosis on MRI and probably some element of piriformis syndrome on exam contributing. Treated today with osteopathic manipulative treatment with good results as above. Continue HEP and follow with PMR for consideration of radiofrequency treatment. Trial duloxetine. F/u 1 month for retreatment as needed.

## 2024-11-10 ENCOUNTER — Ambulatory Visit: Admitting: Student

## 2024-11-10 ENCOUNTER — Other Ambulatory Visit: Payer: Self-pay

## 2024-11-10 ENCOUNTER — Encounter: Payer: Self-pay | Admitting: Student

## 2024-11-10 VITALS — BP 136/92 | HR 74 | Wt 226.8 lb

## 2024-11-10 DIAGNOSIS — R102 Pelvic and perineal pain unspecified side: Secondary | ICD-10-CM | POA: Diagnosis not present

## 2024-11-10 DIAGNOSIS — T8332XD Displacement of intrauterine contraceptive device, subsequent encounter: Secondary | ICD-10-CM | POA: Diagnosis not present

## 2024-11-10 DIAGNOSIS — N939 Abnormal uterine and vaginal bleeding, unspecified: Secondary | ICD-10-CM | POA: Diagnosis not present

## 2024-11-10 DIAGNOSIS — R9389 Abnormal findings on diagnostic imaging of other specified body structures: Secondary | ICD-10-CM | POA: Diagnosis not present

## 2024-11-10 NOTE — Progress Notes (Unsigned)
 " History:  Ms. Alexandra Benjamin is a 48 y.o. who presents to clinic today for follow up regarding lost IUD strings, pelvic pain, and AUB. She visited her PCP on October 24th with concerns related to right-sided abdominal pain and irregular cycles. At that visit, her IUD strings were not visualized and +CM tenderness. Patient was treated for PID and referred for TVUS. US  completed on October 29th and IUD was not visualized within the endometrial cavity. US  findings also raised concerns for enlarged uterus with fibroids or adenomyosis. Pelvic MRI recommended at this time. She has continued to have prolonged, irregular cycles and was treated with Provera  at the end of October. Bleeding stopped and her cycle returned this month. States it was heavy, but likely due to the length of time in between cycles. Patient had a prior history of irregular cycles that were being managed by hormonal therapy. IUD was initiated following concerns of COCs in the setting of uncontrolled blood pressure.Today, patient would like pelvic exam and guidance on next steps.   The following portions of the patient's history were reviewed and updated as appropriate: allergies, current medications, family history, past medical history, social history, past surgical history and problem list.  Review of Systems:  Review of Systems  Constitutional: Negative.   Respiratory: Negative.    Cardiovascular: Negative.   Genitourinary:        Lower right-sided pelvic pain  Musculoskeletal:  Positive for back pain.  Psychiatric/Behavioral: Negative.       Objective:  Physical Exam BP (!) 136/92   Pulse 74   Wt 226 lb 12.8 oz (102.9 kg)   LMP 10/24/2024 (Exact Date)   BMI 37.74 kg/m  Physical Exam Exam conducted with a chaperone present.  Constitutional:      General: She is not in acute distress.    Appearance: Normal appearance.  Cardiovascular:     Rate and Rhythm: Normal rate.  Pulmonary:     Effort: Pulmonary effort is  normal.  Genitourinary:    General: Normal vulva.     Exam position: Supine.     Labia:        Right: No rash, tenderness or lesion.        Left: No rash, tenderness or lesion.      Vagina: Normal.     Cervix: No cervical motion tenderness, discharge, friability or cervical bleeding.     Comments: No IUD strings visualized Musculoskeletal:        General: Normal range of motion.  Psychiatric:        Mood and Affect: Mood normal.        Behavior: Behavior normal.     Labs and Imaging No results found for this or any previous visit (from the past 24 hours).  No results found.  Health Maintenance Due  Topic Date Due   Hepatitis B Vaccines 19-59 Average Risk (1 of 3 - 19+ 3-dose series) Never done   COVID-19 Vaccine (3 - 2025-26 season) 07/20/2024    Labs, imaging and previous visits in Epic and Care Everywhere reviewed  Assessment & Plan:  1. Intrauterine contraceptive device threads lost, subsequent encounter (Primary) - Unable to visualize within endocervical canal or exernal os  - Discussed plan for final attempt to visualize with MRI - Follow-up with MD after MRI - Should expulsion be assumed, does not plan to reinsert IUD  - MR PELVIS W WO CONTRAST; Future  2. Pelvic pain 3. Abnormal pelvic ultrasound 4. Abnormal uterine bleeding -  TVUS showed indeterminate for fibroids vs adenomyosis, recommend MRI pelvis  - Follow-up management with MD after MRI completion - Patient does not desire to continue hormonal therapy with IUD. Would consider low-dose COC if HTN is responsive to antihypertensives - MR PELVIS W WO CONTRAST; Future  Approximately 30 minutes of total time was spent with this patient on exam, chart review, and care management plan.   Return in about 6 weeks (around 12/22/2024).  Hoyle Garre, NP 11/13/2024 12:21 PM  "

## 2024-11-16 ENCOUNTER — Other Ambulatory Visit: Payer: Self-pay | Admitting: Student

## 2024-11-16 ENCOUNTER — Other Ambulatory Visit: Payer: Self-pay

## 2024-11-16 ENCOUNTER — Other Ambulatory Visit (HOSPITAL_COMMUNITY): Payer: Self-pay

## 2024-11-16 MED ORDER — LOSARTAN POTASSIUM-HCTZ 100-25 MG PO TABS
1.0000 | ORAL_TABLET | Freq: Every day | ORAL | 1 refills | Status: AC
  Filled 2024-11-16: qty 60, 60d supply, fill #0

## 2024-11-26 ENCOUNTER — Other Ambulatory Visit (HOSPITAL_COMMUNITY): Payer: Self-pay

## 2024-12-03 ENCOUNTER — Encounter: Payer: Self-pay | Admitting: Student

## 2024-12-10 ENCOUNTER — Encounter: Payer: Self-pay | Admitting: Physical Medicine & Rehabilitation

## 2024-12-10 ENCOUNTER — Encounter: Payer: Self-pay | Attending: Physical Medicine & Rehabilitation | Admitting: Physical Medicine & Rehabilitation

## 2024-12-10 VITALS — BP 125/85 | HR 72 | Ht 65.0 in | Wt 237.4 lb

## 2024-12-10 DIAGNOSIS — G8929 Other chronic pain: Secondary | ICD-10-CM | POA: Diagnosis not present

## 2024-12-10 DIAGNOSIS — M79671 Pain in right foot: Secondary | ICD-10-CM | POA: Diagnosis not present

## 2024-12-10 DIAGNOSIS — M545 Low back pain, unspecified: Secondary | ICD-10-CM | POA: Insufficient documentation

## 2024-12-10 NOTE — Progress Notes (Signed)
 "  Subjective:    Patient ID: Alexandra Benjamin, female    DOB: Dec 24, 1975, 49 y.o.   MRN: [id number]  HPI  Discussed the use of AI scribe software for clinical note transcription with the patient, who gave verbal consent to proceed.    Alexandra Benjamin is a 49 y.o. year old female  who  has a past medical history of Anemia, Anxiety, Exposure to sexually transmitted disease (STD) (09/25/2019), GERD (gastroesophageal reflux disease), Hypertension, and Thyroid  disease.   They are presenting to PM&R clinic as a new patient for pain management evaluation. They were referred by Dr. Rumball for treatment of R lower back pain.   Reports chronic right-sided low back pain for approximately one year, with radiation down the right leg to the heel. Pain began after a fall down stairs at the beginning of the year. Pain location varies. Standing for long periods (e.g., cooking, brushing teeth) exacerbates back pain. Pain also shoots down the leg into the foot, particularly in the morning, throughout the workday (involving sitting and standing), and sometimes at night, disturbing sleep. Pain previously radiated to the toes, but now localizes to the heel. Right calf and posterior knee pain also reported intermittently. Aggravating factors: Prolonged standing, driving for long periods. Relieving factors: Heating pad, pillow between legs when sleeping.  Treatments Tried: Medications:     - Over-the-counter: Tylenol, Advil (provides temporary relief, taken almost daily).     - Prescription: Muscle relaxer (taken once daily, prescribed for every 8 hours).     - Prednisone  course from provided complete but temporary relief. Non-pharmacologic:     - OMT with Dr. Romball every 4-6 weeks provides 2-3 days of relief.     - Physical therapy several months ago provided temporary help. Continues with take-home exercises and yoga stretching.     - TENS unit (borrowed from a friend) was very effective.     - Biofreeze cream  (some help).     - Massage gun (painful but helpful). No history of back injections or surgery. Discussed dry needling with a physical therapist but did not proceed. Denies trying Gabapentin , Lyrica, amitriptyline, nortriptyline, Cymbalta , venlafaxine, hydrocodone, oxycodone, or tramadol.    Medications tried: Topical medications-  biofreeze- didn't help  Nsaids  Advil- helpful Tylenol -helpful Opiates  - denies  Gabapentin  / Lyrica  - Denies  TCAs - Denies  SNRIs - Denies  Prednisone - helpful, had no pain Robaxin - helps a little   Other treatments: PT- Few months ago- temporary benefit  Yoga- hepful OMT treatment- helpful TENs unit- Helpful  Injections - denies  Surgery- denies  Heating pad helpful    Prior UDS results: No results found for: LABOPIA, COCAINSCRNUR, LABBENZ, AMPHETMU, THCU, LABBARB   Interval History 12/10/24 Reports ongoing low back and right leg pain. Pain is worse with prolonged standing, such as at work or in the kitchen. Pain is also present when sitting, requiring a shift in posture to relieve pressure. Pain is described as a dull ache down the right leg to the foot, with tingling in the heel and toes. Continues with yoga and exercise. Also receives OMT treatments from Dr. Rumball, which provides relief for a few days.  Medications - Gabapentin : Tried but caused excessive sedation, so it is not taken during the day. - Cymbalta  (duloxetine ): Prescribed by PCP. Taking daily at night. Reports it helps with pain but also causes sleepiness. Tolerating one pill, does not want to increase dose. - Tylenol: Takes 500 mg on  some days for pain..    Pain Inventory Average Pain 8 Pain Right Now 5 My pain is dull, aching  In the last 24 hours, has pain interfered with the following? General activity 7 Relation with others 5 Enjoyment of life 6 What TIME of day is your pain at its worst? Morning, Daytime and Night Sleep (in general) Fair  Pain  is worse with: walking, standing, some activities Pain improves with: heat/ice, therapy/exercise, and medication Relief from Meds: Fair  walk without assistance how many minutes can you walk? unlimited ability to climb steps?  yes do you drive?  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  no  Any changes since last visit?  no    Family History  Problem Relation Age of Onset   Heart disease Father    Stomach cancer Maternal Grandmother    Diabetes Maternal Grandmother    Diabetes Paternal Grandmother    Colon cancer Neg Hx    Esophageal cancer Neg Hx    Rectal cancer Neg Hx    Social History   Socioeconomic History   Marital status: Legally Separated    Spouse name: Not on file   Number of children: Not on file   Years of education: Not on file   Highest education level: Bachelor's degree (e.g., BA, AB, BS)  Occupational History   Not on file  Tobacco Use   Smoking status: Former    Types: Cigarettes   Smokeless tobacco: Never  Vaping Use   Vaping status: Never Used  Substance and Sexual Activity   Alcohol use: Yes    Alcohol/week: 1.0 - 2.0 standard drink of alcohol    Types: 1 - 2 Glasses of wine per week   Drug use: No   Sexual activity: Yes    Birth control/protection: Surgical, Implant  Other Topics Concern   Not on file  Social History Narrative   Not on file   Social Drivers of Health   Tobacco Use: Medium Risk (12/10/2024)   Patient History    Smoking Tobacco Use: Former    Smokeless Tobacco Use: Never    Passive Exposure: Not on file  Financial Resource Strain: Low Risk (07/18/2023)   Overall Financial Resource Strain (CARDIA)    Difficulty of Paying Living Expenses: Not hard at all  Food Insecurity: No Food Insecurity (11/10/2024)   Epic    Worried About Radiation Protection Practitioner of Food in the Last Year:  Never true    Ran Out of Food in the Last Year: Never true  Transportation Needs: No Transportation Needs (11/10/2024)   Epic    Lack of Transportation (Medical): No    Lack of Transportation (Non-Medical): No  Physical Activity: Insufficiently Active (07/18/2023)   Exercise Vital Sign    Days of Exercise per Week: 4 days    Minutes of Exercise per Session: 20 min  Stress: No Stress Concern Present (07/18/2023)   [employer] of Occupational Health - Occupational Stress Questionnaire    Feeling of Stress : Only a little  Social Connections: Socially Isolated (07/18/2023)   Social Connection and Isolation Panel    Frequency of Communication with Friends and Family: More than three times a week    Frequency of Social Gatherings with Friends and Family: Three times a week    Attends Religious Services: Never  Active Member of Clubs or Organizations: No    Attends Banker Meetings: Not on file    Marital Status: Separated  Depression (PHQ2-9): Low Risk (11/10/2024)   Depression (PHQ2-9)    PHQ-2 Score: 2  Alcohol Screen: Low Risk (07/18/2023)   Alcohol Screen    Last Alcohol Screening Score (AUDIT): 2  Housing: Low Risk (07/18/2023)   Housing    Last Housing Risk Score: 0  Utilities: Not on file  Health Literacy: Not on file   Past Surgical History:  Procedure Laterality Date   COLONOSCOPY  08/23/2023   eyelid surgery     FINGER FRACTURE SURGERY     TUBAL LIGATION     Past Medical History:  Diagnosis Date   Anemia    Anxiety    Exposure to sexually transmitted disease (STD) 09/25/2019   GERD (gastroesophageal reflux disease)    Hypertension    Thyroid  disease    Graves disease   BP 125/85 (BP Location: Left Arm, Patient Position: Sitting, Cuff Size: Normal)   Pulse 72   Ht 5' 5 (1.651 m)   Wt 237 lb 6.4 oz (107.7 kg)   LMP 10/24/2024 (Exact Date)   SpO2 98%   BMI 39.51 kg/m   Opioid Risk Score:   Fall Risk Score:  `1  Depression screen [facility]  2/9     11/10/2024    5:07 PM 10/12/2024    3:08 PM 09/18/2024    2:34 PM 09/11/2024    8:51 AM 03/26/2024    3:53 PM 02/26/2024   10:35 AM 02/19/2024   10:40 AM  Depression screen PHQ 2/9  Decreased Interest 0 0 0 0 0 0 0  Down, Depressed, Hopeless 0 0 0 0 0 0 0  PHQ - 2 Score 0 0 0 0 0 0 0  Altered sleeping 1 2 2 2 2 1 1   Tired, decreased energy 1 2 2 2  0  1  Change in appetite 0 0 0 0 1 1 1   Feeling bad or failure about yourself  0 0 0 0 0 0 0  Trouble concentrating 0 0 0 0 0 0 0  Moving slowly or fidgety/restless 0 0 0 0 0 0 0  Suicidal thoughts 0 0 0 0 0 0 0  PHQ-9 Score 2 4 4  4  3  2  3    Difficult doing work/chores  Somewhat difficult          Data saved with a previous flowsheet row definition     Review of Systems  Gastrointestinal:  Positive for constipation.  Endocrine:       High blood sugar  Musculoskeletal:  Positive for back pain.       Right lower back pain radiating into buttock, thigh and right foot pain  Neurological:  Positive for weakness.       Tingling  All other systems reviewed and are negative.      Objective:   Physical Exam   Gen: no distress, normal appearing HEENT: oral mucosa pink and moist, NCAT Chest: normal effort, normal rate of breathing Abd: soft, non-distended Ext: no edema Psych: pleasant, normal affect Skin: intact Neuro: Alert and awake, follows commands, cranial nerves II through XII grossly intact, normal speech and language  RLE: HF 5/5, KE 5/5, ADF 5/5, APF 5/5 LLE: HF 5/5, KE 5/5, ADF 5/5, APF 5/5 Sensation intact in all 4 extremities-slightly altered right heel No limb ataxia or cerebellar signs. No abnormal tone appreciated.  Musculoskeletal:   Tenderness over the right lower lumbar spine. No tenderness on the left.  Patient has some tenderness right greater than left SI joints Straight leg raise is negative for radicular pain but elicits tightness in the posterior thigh.  Sensation to light touch is intact.   Bending backwards elicits pain in the lower right back. FABER negative bilaterally Iliac compression negative      MRI L spine 08/11/24 There is normal alignment of the lumbar spine. There is no vertebral body height loss, subluxation or marrow replacing process. The sacrum and SI joints are unremarkable so far as visualized. Conus and cauda equina are unremarkable.   T12-L1: There is no focal disc protrusion, foraminal or spinal stenosis. Mild facet arthrosis.   L1-2: There is no focal disc protrusion, foraminal or spinal stenosis. Mild facet arthrosis.   L2-3: There is no focal disc protrusion, foraminal or spinal stenosis.   L3-4: There is no focal disc protrusion, foraminal or spinal stenosis. Mild facet arthrosis.   L4-5: There is no focal disc protrusion, foraminal or spinal stenosis. Mild-to-moderate facet arthrosis.   L5-S1: There is no focal disc protrusion, foraminal or spinal stenosis. Moderate facet arthrosis.   The retroperitoneal structures demonstrate no significant abnormality. Partially imaged slightly prominent uterus is incompletely evaluated.   IMPRESSION:   Minimal disc desiccation and mild-to-moderate facet arthrosis. No acute abnormality. No focal protrusion, foraminal or spinal stenosis.   Partially imaged slightly prominent uterus is incompletely evaluated.     Assessment & Plan:    Chronic right-sided low back pain: Features suggestive of right-sided radiculopathy.  MRI findings are notable for mild to moderate lumbar facet arthropathy, most pronounced at the lower levels, which is a likely contributor to her axial back pain.   R Foot Pain The etiology of her radicular symptoms, including right heel numbness, is less clear, as the MRI did not show significant neural foraminal stenosis or nerve root impingement.  History of acid reflux  PLAN: -Continue current regimen of Cymbalta  30 mg daily and Gabapentin .  She only uses gabapentin   about once a day due to sedation.  Dose escalation limited by sedation.  Discussed Pregabalin (Lyrica) as an alternative, but will defer for now. -TENS Unit: ordered last visit -Anti-inflammatory Diet: Provided a handout on anti-inflammatory foods. The patient reports already incorporating some items like tart cherry juice and blueberries into her diet. -Continue Home Exercises: Encouraged to continue with her current home exercise program, including yoga and stretching. -Refer to Dr. Sixto MBB L3-4,L4-5, L5-S1 and visit with me 2 weeks later.  If only 2 levels done would likely focus L4-5, L5-S1 -EMG/Nerve Conduction Study: Discussed as a potential future diagnostic test to further evaluate the etiology of her right leg and foot symptoms, including the heel numbness. Pt will consider for later time.       "

## 2024-12-15 ENCOUNTER — Other Ambulatory Visit

## 2025-01-05 ENCOUNTER — Other Ambulatory Visit

## 2025-01-15 ENCOUNTER — Encounter: Attending: Physical Medicine & Rehabilitation | Admitting: Physical Medicine & Rehabilitation

## 2025-02-02 ENCOUNTER — Encounter: Admitting: Physical Medicine & Rehabilitation
